# Patient Record
Sex: Female | Born: 1975 | Race: White | Hispanic: No | Marital: Married | Smoking: Never smoker
Health system: Southern US, Community
[De-identification: ages and names within clinical notes are randomized; demographics above are authoritative.]

## PROBLEM LIST (undated history)

## (undated) DIAGNOSIS — I471 Supraventricular tachycardia, unspecified: Secondary | ICD-10-CM

## (undated) HISTORY — DX: Supraventricular tachycardia, unspecified: I47.10

---

## 2006-07-18 ENCOUNTER — Inpatient Hospital Stay (HOSPITAL_COMMUNITY): Admission: AD | Admit: 2006-07-18 | Discharge: 2006-07-20 | Payer: Self-pay | Admitting: Obstetrics and Gynecology

## 2008-07-02 ENCOUNTER — Inpatient Hospital Stay (HOSPITAL_COMMUNITY): Admission: AD | Admit: 2008-07-02 | Discharge: 2008-07-03 | Payer: Self-pay | Admitting: Obstetrics and Gynecology

## 2010-04-24 ENCOUNTER — Inpatient Hospital Stay (HOSPITAL_COMMUNITY): Admission: AD | Admit: 2010-04-24 | Discharge: 2010-04-24 | Payer: Self-pay | Admitting: Obstetrics and Gynecology

## 2010-10-01 LAB — HCG, QUANTITATIVE, PREGNANCY: hCG, Beta Chain, Quant, S: 17438 m[IU]/mL — ABNORMAL HIGH (ref <5)

## 2010-12-01 NOTE — H&P (Signed)
Leslie Cline, Leslie Cline           ACCOUNT NO.:  [id number]   MEDICAL RECORD NO.:  [id number]          PATIENT TYPE:  INP   LOCATION:  9164                          [facility]   PHYSICIAN:  Crist Fat. Rivard, M.D. DATE OF BIRTH:  05/01/1976   DATE OF ADMISSION:  07/02/2008  DATE OF DISCHARGE:                              HISTORY & PHYSICAL   Leslie Cline is a 35 year old married white female, gravida 3, para 2-0-  0-2 at [redacted] weeks gestation for an [facility] of July 17, 2008, who presents  in active labor.  Denies leakage of fluid or vaginal bleeding.  Positive  fetal movement.  She is without complaints with the exception of pain  with her contractions.  She declines any pain intervention at present.  She has been followed by the [facility] service at [facility].   PAST MEDICAL HISTORY:  1. History of 4-1/2 hour labor with first delivery.  2. LATEX allergy which causes hives.   OBSTETRICAL HISTORY:  Gravida 1 with a spontaneous vaginal delivery in  June, 2006 at [redacted] weeks gestation, a female by the name of Leslie Cline.  Weight  was 7 pounds, 1 ounce after 4-1/2 hours of labor.  No anesthesia.  No  complications.  It was in [place], [place].  Did receive  Pitocin after delivery.  Gravida 2 with a spontaneous vaginal delivery  at 39 weeks in December, 2007.  A female by the name of Leslie Cline.  He  weighed 7 pounds, 12 ounces.  After 7-1/2 hours of labor.  Was delivered  by Wynelle Bourgeois.  That pregnancy was complicated by a vanishing twin.  Gravida 3 is current pregnancy.   PRENATAL LABS:  Blood type is A positive.  Rh antibody screen was  negative.  RPR nonreactive.  Rubella titer immune.  Hepatitis B surface  antigen negative.  Hemoglobin on January 12, 2008 was 12.1, hematocrit  36.1, platelets 234.  Gonorrhea and Chlamydia cultures negative.  Her  Pap was negative.  She did decline HIV at the time.   PAST MEDICAL HISTORY:  She denies medication allergies.  She does have a  LATEX allergy which  causes hives.   MENSTRUAL HISTORY:  Reports menarche at age 89.  Monthly cycles.  No  abnormalities.  LMP of October 11, 2007.  EDC on July 17, 2008.  Occasional yeast infection.  She had group beta strep with her first  pregnancy.  Varicella as a child.  Smoked from 1997 to 2000.   SURGICAL HISTORY:  Remarkable for wisdom teeth in 1996.  Does have drop  in blood pressure with anesthesia.  She had childhood meningitis at age  89 weeks.   FAMILY HISTORY:  Maternal grandmother and paternal grandfather.  Hypertension.  Paternal grandfather aneurysm.  Maternal grandfather  phlebitis after surgery.  Maternal grandfather and paternal grandfather:  Chronic renal disease.  Maternal aunt, numerous kidney stones.  Father  is bipolar.  Mother depression.  Father and mother alcoholic.  Father  also has had some drug abuse.   GENETIC HISTORY:  Remarkable for second pregnancy was a twin gestation  with a vanishing twin and  subsequent singleton gestation.   SOCIAL HISTORY:  She is a married white female.  She is of [place]  faith.  The father of the baby and her husband, his name is Leslie Cline.  He is involved and supportive.  Patient has had 17 years of  education.  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  Father of the baby has  had 17 years of education and is a Control and instrumentation engineer.  Denied alcohol,  tobacco, or illicit drug use during the pregnancy.   HISTORY OF PRESENT PREGNANCY:  She entered care for her new OB workup on  June 26.  She is around 13 weeks and 2 days.  Desired CMM care.  Pre-  gravid weight was 122.  She is 5 foot, 5 inches tall, so BMI of 20.3,  which is a normal weight.  Prenatal labs and Pap and cultures were  obtained that day.  Declined first trimester screen or other genetic  screening.  She did report that her youngest son was recovering from C.  diff and having some GI issues at that time and had been in the hospital  and followed by a GI specialist.  Several  times during the pregnancy,  she had been considering a home birth.  She had her anatomy ultrasound  at 20-5/7 weeks.  Normal anatomy, normal fluids, 3-vessel cord.  She had  a 1-hour glucola at 28-5/7 weeks.  Small diaphysis with no hernia noted.  Did decline receiving H1N1 vaccine.  She was referred in her third  trimester to dermatology regarding a large mole on her chest.  Plan was  made to remove after delivery.  Otherwise, her pregnancy progressed  without any other complications of significance until her presentation  in labor today.   OBJECTIVE:  VITAL SIGNS ON ADMISSION:  118/64, heart rate 80.  She was  afebrile.  Respirations were 18 in between contractions.  Fetal heart  rate 125.  Marked variability.  No decels.  Some 10 x 10 accelerations.  Toco on admission:  Contractions every 2-3 minutes.  Minor on palpation.  GENERAL:  She had labored breathing, grimaced during contractions.  She  is alert and oriented x3.  In between, was very pleasant with a sense of  humor.  HEENT:  Grossly intact and within normal limits.  She does have glasses.  CARDIOVASCULAR:  Regular rate and rhythm without murmur.  LUNGS:  Clear to auscultation bilaterally.  ABDOMEN:  Soft, nontender, and gravid.  PELVIC:  Cervix was 580, -1, with bulging membranes on exam on  admission.  EXTREMITIES:  Without edema.  No clonus.  DTRs are 2+.   IMPRESSION:  1. Intrauterine pregnancy at 38 weeks.  2. Group B strep negative.  3. Reassuring fetal heart tracing.  4. Active labor.   PLAN:  1. Admit to birthing suites with Dr. Estanislado Pandy as attending physician.  2. Routine L and D orders.  3. Support p.r.n.  4. AROM p.r.n.     Candice Slaton, CNM      Dois Davenport A. Rivard, M.D.  Electronically Signed   CHS/MEDQ  D:  07/02/2008  T:  07/02/2008  Job:  [id number]

## 2010-12-04 NOTE — H&P (Signed)
Leslie Cline, Leslie Cline           ACCOUNT NO.:  [id number]   MEDICAL RECORD NO.:  [id number]          PATIENT TYPE:  INP   LOCATION:  9164                          [facility]   PHYSICIAN:  Leslie Cline, M.D. DATE OF BIRTH:  1976/04/12   DATE OF ADMISSION:  07/18/2006  DATE OF DISCHARGE:                              HISTORY & PHYSICAL   HISTORY OF PRESENT ILLNESS:  Leslie Cline is a 35 year old gravida 2,  para 1-0-0-1 at 36 weeks who presents with uterine contractions every  five minutes for the last several hours.   History has been remarkable for:  1. History of rapid labor.  2. Initial twin pregnancy, now singleton.  3. Latex sensitivity.  4. Group B strep negative.   PRENATAL LABS:  Blood type is A positive.  Rh antibody negative.  VDRL  nonreactive.  Rubella titer positive.  Hepatitis B surface antigen  negative.  HIV was nonreactive.  Parvo titer were immune.  Cystic  fibrosis testing was declined.  GC and Chlamydia cultures were negative  in May.  Pap was normal.  Glucose challenge was normal.  Quadruple  screen was declined.  Hemoglobin upon entry into care was 12.2; it was  within normal limits at 28 weeks.  EDC of 07/24/2006 was established by  seven week ultrasound and questionable LMP.  Group B strep culture was  negative at 36 weeks.   HISTORY OF PRESENT PREGNANCY:  Patient transferred in from [place] to [facility] at 31 weeks.  She did have records  present.  This had initially been a twin pregnancy prior to eight weeks,  but then a singleton was only identified at eight weeks.  She had had no  complications.  Her pregnancy went well.  Group B strep culture was  negative.  She declined GC/Chlamydia cultures at 35 weeks.   OBSTETRICAL HISTORY:  In 2006 June she had a vaginal birth of a female  infant, weight 7 pounds 1 ounce, at 38 weeks.  She was in labor 4-1/2  hours.  She had no anesthesia.  She did have Pitocin during that labor.  She  did have positive Group B strep during that pregnancy.   She has an occasional yeast infection.  She reports usual childhood  illnesses.  She has a history of one urinary tract infection in the  past.  She was a smoker in the past but stopped back in 2002.   PAST SURGICAL HISTORY:  Wisdom teeth removed in 1996.  She did have a  low blood pressure following that.  Her only other hospitalization was  for childbirth and for meningitis at age 69 weeks.   FAMILY HISTORY:  Maternal grandmother and paternal grandfather had  hypertension.  Paternal grandfather had aneurysm.  Maternal grandfather  had phlebitis after surgery.  Maternal grandfather, maternal aunt and  paternal grandfather had numerous kidney stones.  Her father is bipolar.  Her mother has a history of depression.  Her father and mother are both  alcoholic, and her father was a drug abuser.   GENETIC HISTORY:  Unremarkable.   SOCIAL HISTORY:  Patient is married to the father of the baby.  He is  involved and supportive.  His name is Leslie Cline.  Patient is  graduate educated.  She is a Futures trader.  Her husband is also graduate  educated.  He is an Art gallery manager.  Patient has been followed by the  certified nurse midwife service at [facility].  She denies any  alcohol, drug or tobacco use during this pregnancy.  She is Caucasian  and of the [place] faith.   PHYSICAL EXAMINATION:  VITAL SIGNS:  Stable.  Patient is afebrile.  HEENT:  Within normal limits.  LUNGS:  Bilateral breath sounds are clear.  HEART:  Regular rate and rhythm without murmur.  BREASTS:  Soft and nontender.  ABDOMEN:  Fundal height is approximately 38 cm.  Estimated fetal weight  is 7-1/2 pounds.  Uterine contractions are every 3-5 minutes, moderate  quality.  PELVIC:  Cervical exam is 4 cm, 90%, vertex, and a -1 station, with  bulging bag of water.  Fetal heart rate is reassuring with some  scattered accelerations.  EXTREMITIES:  Deep tendon  reflexes are 2+ without clonus.  There is  trace edema noted.   IMPRESSION:  1. Intrauterine pregnancy at 39 weeks.  2. Early active labor.  3. Negative Group B strep.   PLAN:  1. Admitting to birthing suite per consult with Dr. Silverio Lay, who      is the attending physician.  2. Routine certified nurse midwife orders.  3. Patient desires a non-intervention birth as much as possible.  4. Anticipate normal spontaneous vaginal birth.      Leslie Cline, C.N.M.      Leslie Cline, M.D.  Electronically Signed    VLL/MEDQ  D:  07/18/2006  T:  07/18/2006  Job:  [id number]

## 2010-12-12 ENCOUNTER — Inpatient Hospital Stay (HOSPITAL_COMMUNITY)
Admission: AD | Admit: 2010-12-12 | Discharge: 2010-12-14 | DRG: 775 | Disposition: A | Discharge status: Home / Self Care | Payer: Managed Care, Other (non HMO) | Source: Ambulatory Visit | Attending: Obstetrics and Gynecology | Admitting: Obstetrics and Gynecology

## 2010-12-13 LAB — CBC
HCT: 34.5 % — ABNORMAL LOW (ref 36.0–46.0)
Hemoglobin: 11.1 g/dL — ABNORMAL LOW (ref 12.0–15.0)
MCH: 27.7 pg (ref 26.0–34.0)
MCHC: 32.2 g/dL (ref 30.0–36.0)
MCV: 86 fL (ref 78.0–100.0)
Platelets: 205 10*3/uL (ref 150–400)
RBC: 4.01 MIL/uL (ref 3.87–5.11)
RDW: 14.9 % (ref 11.5–15.5)
WBC: 9.6 10*3/uL (ref 4.0–10.5)

## 2010-12-13 LAB — RPR: RPR Ser Ql: NONREACTIVE

## 2010-12-14 LAB — CBC
HCT: 33.2 % — ABNORMAL LOW (ref 36.0–46.0)
Hemoglobin: 10.4 g/dL — ABNORMAL LOW (ref 12.0–15.0)
MCH: 27.4 pg (ref 26.0–34.0)
MCHC: 31.3 g/dL (ref 30.0–36.0)
MCV: 87.4 fL (ref 78.0–100.0)
Platelets: 203 10*3/uL (ref 150–400)
RBC: 3.8 MIL/uL — ABNORMAL LOW (ref 3.87–5.11)
RDW: 15.4 % (ref 11.5–15.5)
WBC: 7.8 10*3/uL (ref 4.0–10.5)

## 2010-12-28 NOTE — H&P (Signed)
Leslie Cline, Leslie Cline           ACCOUNT NO.:  [id number]  MEDICAL RECORD NO.:  [id number]           PATIENT TYPE:  I  LOCATION:  9124                          [facility]  PHYSICIAN:  Janine Limbo, M.D.DATE OF BIRTH:  [birth year]  DATE OF ADMISSION:  12/12/2010 DATE OF DISCHARGE:  12/14/2010                             HISTORY & PHYSICAL   Leslie Cline is a 35 year old gravida 4, para 3-0-0-3 at 39-2/7 weeks, who presents in active labor on the morning of Dec 13, 2010.  The patient's history has been remarkable for; 1. Latex allergy. 2. History of rapid labor. 3. Third trimester anemia. 4. Conception with Mirena. 5. Group B strep negative. 6. Plans water birth.  PRENATAL LABS:  Blood type is A+.  Rh antibody negative.  RPR nonreactive.  Hepatitis B surface antigen negative.  HIV was nonreactive.  Rubella titer was immune.  Hemoglobin upon entering the practice was 11.5.  Platelet count was within normal limits.  The patient declined any genetic screening.  She had a thyroid panel and vitamin D done at 13 weeks for fatigue.  These were all within normal limits.  She had an 1-hour Glucola that was normal.  Hemoglobin was 9.7 at 28 weeks.  Group B strep culture was negative at 36 weeks.  HISTORY OF PRESENT PREGNANCY:  The patient entered care at approximately 8 weeks.  She declined screening.  She has conceived on Mirena IUD and this was removed by Dr. Stefano Gaul on April 27, 2010 in early pregnancy without difficulty.  She had an ultrasound at her first visit showing normal growth and development and EDC of December 20, 2010.  She had increased nausea and vomiting and fatigue around 13 weeks.  She had TSH, thyroid panel, and vitamin D done that were all normal.  She was in physical therapy for sacral and pelvic pain and pelvic relaxation.  In 19 weeks, she had another ultrasound, showing normal growth and development.  She had some vulvar and leg varicosities at 28  weeks. Comfort measures were reviewed.  Her hemoglobin was 9.2 at 28 weeks. Her Glucola was normal.  She was recommended to take Floradix.  She had some early contractions at 35 weeks.  Fetal fibronectin was done, and it was negative.  Thus, her pregnancy was essentially uncomplicated by 34 weeks.  She decided that she wanted to do a water birth and had obtained a tub, attended class, and signed a consent.  OBSTETRICAL HISTORY:  In 2006, she had a vaginal birth of a female infant, weight 7 pounds 1 ounce at 38 weeks.  She was in labor 4-1//2 hours. She had no anesthesia.  The child was born in [place].  In 2007, she had a vaginal birth of a female infant, weight 7 pounds 12 ounces at 39 weeks.  She was in labor 7-1/2 hours that had been an initial twin pregnancy that spontaneously reduced itself in the first trimester to the singleton.  In 2009, she had a vaginal birth of a female infant, weight 7 pounds 9 ounces at 38 weeks.  She was in labor less than 4 hours.  She had no  anesthesia.  She did conceive this pregnancy with a Mirena in place.  This was discontinued in early pregnancy by Dr. Stefano Gaul.  MEDICAL HISTORY:  She reports usual childhood illnesses.  She had a history of one UTI in the past.  SURGICAL HISTORY:  Includes, wisdom teeth removed in 1996.  She did have decreased blood pressure during that procedure.  She was hospitalized with meningitis when she was 35 years old and then for childbirth three times.  ALLERGIES:  She has sensitive to LATEX which causes hives.  FAMILY HISTORY:  Maternal grandmother, paternal grandfather had hypertension.  Her paternal grandfather had an aneurysm.  Maternal grandfather had phlebitis after surgery.  Maternal grandfather and paternal grandfather and maternal aunt all had history of renal stones. Her father is bipolar.  Her mother had depression.  Her father and mother are alcoholics and her father also used drugs.  GENETIC HISTORY:   Remarkable for the second pregnancy being a twin gestation that reduced itself spontaneously to a singleton.  SOCIAL HISTORY:  The patient is married to the father of baby.  He is involved and supportive. His name is Cyla Haluska.  The patient is of the [place] faith.  She is Caucasian.  She is graduate educated.  She is a stay-at-home mom.  Her husband is also graduated.  He is an Art gallery manager with [employer]  She has been followed by the certified nurse midwife service, [place].  She denies any alcohol, drug, or tobacco use during this pregnancy.  She was a previous smoker.  PHYSICAL EXAM:  VITAL SIGNS:  Stable.  The patient is febrile. HEENT: Within normal limits. LUNGS:  Breath sounds are clear. HEART:  Regular rate and rhythm without murmur. BREASTS:  Soft and nontender. ABDOMEN:  Fundal height is approximately 38 weeks.  Uterine contractions every 3-4 minutes.  The patient is 4-5 cm, 80% vertex at -1 station with bulging bag of water.   Fetal heart rate is reassuring and reactive on external monitor.  Membranes are intact.   EXTREMITIES:  Deep tendon reflexes are 2+ without clonus.  There is a trace edema noted.  IMPRESSION: 1. Intrauterine pregnancy at 39-2/7 weeks. 2. Active labor. 3. History of rapid labor. 4. Negative group B strep. 5. Plans water birth.  PLAN: 1. Admit to birthing suite per consult with Dr. Marline Backbone as     attending physician. 2. Routine certified nurse midwife orders. 3. The patient will use birth tub and we may monitor intermittently.    Renaldo Reel Emilee Hero, C.N.M.   ______________________________ Janine Limbo, M.D.   VLL/MEDQ  D:  12/15/2010  T:  12/16/2010  Job:  [id number]  Electronically Signed by Nigel Bridgeman C.N.M. on 12/17/2010 11:05:26 AM Electronically Signed by Kirkland Hun M.D. on 12/28/2010 03:48:44 PM

## 2011-04-23 LAB — CBC
HCT: 33 % — ABNORMAL LOW (ref 36.0–46.0)
HCT: 35.8 % — ABNORMAL LOW (ref 36.0–46.0)
Hemoglobin: 11.6 g/dL — ABNORMAL LOW (ref 12.0–15.0)
Hemoglobin: 12 g/dL (ref 12.0–15.0)
MCHC: 33.6 g/dL (ref 30.0–36.0)
MCHC: 35.1 g/dL (ref 30.0–36.0)
MCV: 91.7 fL (ref 78.0–100.0)
MCV: 92.3 fL (ref 78.0–100.0)
Platelets: 228 10*3/uL (ref 150–400)
Platelets: 233 10*3/uL (ref 150–400)
RBC: 3.6 MIL/uL — ABNORMAL LOW (ref 3.87–5.11)
RBC: 3.88 MIL/uL (ref 3.87–5.11)
RDW: 13.1 % (ref 11.5–15.5)
RDW: 13.2 % (ref 11.5–15.5)
WBC: 8.3 10*3/uL (ref 4.0–10.5)
WBC: 9.9 10*3/uL (ref 4.0–10.5)

## 2011-04-23 LAB — RPR: RPR Ser Ql: NONREACTIVE

## 2011-04-23 LAB — RAPID HIV SCREEN (WH-MAU): Rapid HIV Screen: NONREACTIVE

## 2012-05-16 ENCOUNTER — Ambulatory Visit: Payer: Self-pay | Admitting: Obstetrics and Gynecology

## 2014-09-06 ENCOUNTER — Other Ambulatory Visit: Payer: Self-pay | Admitting: Internal Medicine

## 2014-09-06 DIAGNOSIS — R29818 Other symptoms and signs involving the nervous system: Secondary | ICD-10-CM

## 2014-09-16 ENCOUNTER — Other Ambulatory Visit: Payer: Managed Care, Other (non HMO)

## 2017-10-17 ENCOUNTER — Other Ambulatory Visit: Payer: Self-pay | Admitting: Neurosurgery

## 2017-10-17 DIAGNOSIS — D481 Neoplasm of uncertain behavior of connective and other soft tissue: Secondary | ICD-10-CM

## 2017-10-28 ENCOUNTER — Ambulatory Visit
Admission: RE | Admit: 2017-10-28 | Discharge: 2017-10-28 | Disposition: A | Discharge status: Home / Self Care | Payer: Commercial Managed Care - PPO | Source: Ambulatory Visit | Attending: Neurosurgery | Admitting: Neurosurgery

## 2017-10-28 ENCOUNTER — Other Ambulatory Visit: Payer: Self-pay | Admitting: Neurosurgery

## 2017-10-28 DIAGNOSIS — D481 Neoplasm of uncertain behavior of connective and other soft tissue: Secondary | ICD-10-CM

## 2019-07-16 ENCOUNTER — Other Ambulatory Visit: Payer: Self-pay

## 2019-07-16 ENCOUNTER — Ambulatory Visit (INDEPENDENT_AMBULATORY_CARE_PROVIDER_SITE_OTHER): Payer: Commercial Managed Care - PPO | Admitting: Family Medicine

## 2019-07-16 ENCOUNTER — Ambulatory Visit (INDEPENDENT_AMBULATORY_CARE_PROVIDER_SITE_OTHER): Payer: Commercial Managed Care - PPO

## 2019-07-16 ENCOUNTER — Encounter: Payer: Self-pay | Admitting: Family Medicine

## 2019-07-16 VITALS — BP 120/70 | HR 94 | Ht 65.0 in

## 2019-07-16 DIAGNOSIS — M5416 Radiculopathy, lumbar region: Secondary | ICD-10-CM

## 2019-07-16 DIAGNOSIS — G8929 Other chronic pain: Secondary | ICD-10-CM

## 2019-07-16 DIAGNOSIS — M545 Low back pain, unspecified: Secondary | ICD-10-CM

## 2019-07-16 MED ORDER — PREDNISONE 50 MG PO TABS: ORAL_TABLET | ORAL | 0 refills | Status: AC

## 2019-07-16 MED ORDER — METHYLPREDNISOLONE ACETATE 80 MG/ML IJ SUSP
80.0000 mg | Freq: Once | INTRAMUSCULAR | Status: AC
  Administered 2019-07-16: 13:00:00 80 mg via INTRAMUSCULAR

## 2019-07-16 MED ORDER — GABAPENTIN 100 MG PO CAPS: 200.0000 mg | ORAL_CAPSULE | Freq: Every day | ORAL | 3 refills | Status: DC

## 2019-07-16 MED ORDER — KETOROLAC TROMETHAMINE 60 MG/2ML IM SOLN
60.0000 mg | Freq: Once | INTRAMUSCULAR | Status: AC
  Administered 2019-07-16: 60 mg via INTRAMUSCULAR

## 2019-07-16 NOTE — Assessment & Plan Note (Signed)
Lumbar radiculopathy.  Patient has positive straight leg test on the right side.  Mostly no weakness.  Deep tendon reflexes intact.  Patient has had a cyst noted on MRI previously but it was left-sided.  At this time still symptoms seem to be of the right side.  Discussed the possibility of epidurals which patient has declined.  Toradol and Depo-Medrol given today.  Different home exercises, discussed which activities to potentially avoid.  Discussed icing regimen, patient will try to make these changes and follow-up with me again in.  Patient will then come back in 2 weeks and consider manipulation due to the severity of pain today we will also get x-rays.

## 2019-07-16 NOTE — Progress Notes (Signed)
[facility] [address] [place], [place] [address] Phone: [phone] Subjective:     This visit occurred during the SARS-CoV-2 public health emergency.  Safety protocols were in place, including screening questions prior to the visit, additional usage of staff PPE, and extensive cleaning of exam room while observing appropriate contact time as indicated for disinfecting solutions.       CC: Low back pain  [id number]  Leslie Cline is a 43 y.o. female coming in with complaint of low back pain.  Has had this intermittently for nearly 2 years.  Patient has done formal physical therapy with no significant movement.  States it can come and go but then seems to worsen and often stops daily activities even Has seen neurosurgery previously and did have an MRI of the lumbar spine April 2019.  This was independently visualized by me. 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  Cyst that was previously stated was at L5-S1     Social History   Socioeconomic History  . Marital status: Married    Spouse name: Not on file  . Number of children: Not on file  . Years of education: Not on file  . Highest education level: Not on file  Occupational History  . Not on file  Tobacco Use  . Smoking status: Not on file  Substance and Sexual Activity  . Alcohol use: Not on file  . Drug use: Not on file  . Sexual activity: Not on file  Other Topics Concern  . Not on file  Social History Narrative  . Not on file   Social Determinants of Health   Financial Resource Strain:   . Difficulty of Paying Living Expenses: Not on file  Food Insecurity:   . Worried About Charity fundraiser in the Last Year: Not on file  . Ran Out of Food in the Last Year: Not on file  Transportation Needs:   . Lack of Transportation  (Medical): Not on file  . Lack of Transportation (Non-Medical): Not on file  Physical Activity:   . Days of Exercise per Week: Not on file  . Minutes of Exercise per Session: Not on file  Stress:   . Feeling of Stress : Not on file  Social Connections:   . Frequency of Communication with Friends and Family: Not on file  . Frequency of Social Gatherings with Friends and Family: Not on file  . Attends Religious Services: Not on file  . Active Member of Clubs or Organizations: Not on file  . Attends Archivist Meetings: Not on file  . Marital Status: Not on file   Not on File No family history on file.  No family history of autoimmune  Current Outpatient Medications (Endocrine & Metabolic):  .  predniSONE (DELTASONE) 50 MG tablet, 1 tablet by mouth daily      Current Outpatient Medications (Other):  .  gabapentin (NEURONTIN) 100 MG capsule, Take 2 capsules (200 mg total) by mouth at bedtime.    Past medical history, social, surgical and family history all reviewed in electronic medical record.  No pertanent information unless stated regarding to the chief complaint.   Review of Systems:  No headache, visual changes, nausea, vomiting, diarrhea, constipation, dizziness, abdominal pain, skin rash, fevers, chills, night sweats, weight loss, swollen lymph  nodes, body aches, joint swelling, muscle aches, chest pain, shortness of breath, mood changes.   Objective  Blood pressure 120/70, pulse 94, height 5\' 5"  (1.651 m), last menstrual period 06/19/2019, SpO2 98 %. Systems examined below as of    General: No apparent distress alert and oriented x3 mood and affect normal, dressed appropriately.  HEENT: Pupils equal, extraocular movements intact  Respiratory: Patient's speak in full sentences and does not appear short of breath  Cardiovascular: No lower extremity edema, non tender, no erythema  Skin: Warm dry intact with no signs of infection or rash on extremities or on axial  skeleton.  Abdomen: Soft nontender  Neuro: Cranial nerves II through XII are intact, neurovascularly intact in all extremities with 2+ DTRs and 2+ pulses.  Lymph: No lymphadenopathy of posterior or anterior cervical chain or axillae bilaterally.  Gait normal with good balance and coordination.  MSK:  Non tender with full range of motion and good stability and symmetric strength and tone of shoulders, elbows, wrist, hip, knee and ankles bilaterally.  Back exam does have some mild loss of lordosis.  Positive straight leg test on the right side at 20 degrees of forward flexion with radicular symptoms.  Patient has tenderness to palpation in the paraspinal musculature from L4-S1 bilaterally.  Mild pain in the piriformis bilaterally.  Neurovascular intact and deep tendon reflexes.    [id number]; 15 additional minutes spent for Therapeutic exercises as stated in above notes.  This included exercises focusing on stretching, strengthening, with significant focus on eccentric aspects.   Long term goals include an improvement in range of motion, strength, endurance as well as avoiding reinjury. Patient's frequency would include in 1-2 times a day, 3-5 times a week for a duration of 6-12 weeks. Low back exercises that included:  Pelvic tilt/bracing instruction to focus on control of the pelvic girdle and lower abdominal muscles  Glute strengthening exercises, focusing on proper firing of the glutes without engaging the low back muscles Proper stretching techniques for maximum relief for the hamstrings, hip flexors, low back and some rotation where tolerated    Proper technique shown and discussed handout in great detail with ATC.  All questions were discussed and answered.   Impression and Recommendations:     This case required medical decision making of moderate complexity. The above documentation has been reviewed and is accurate and complete Leslie Pulley, DO       Note: This dictation was prepared  with Dragon dictation along with smaller phrase technology. Any transcriptional errors that result from this process are unintentional.

## 2019-07-16 NOTE — Progress Notes (Deleted)
[facility] [address] [place], [place] [address] Phone: [phone] Subjective:   I Leslie Cline am serving as a Education administrator for Dr. Hulan Saas.  This visit occurred during the SARS-CoV-2 public health emergency.  Safety protocols were in place, including screening questions prior to the visit, additional usage of staff PPE, and extensive cleaning of exam room while observing appropriate contact time as indicated for disinfecting solutions.   I'm seeing this patient by the request  of:    CC:   [id number]  Leslie Cline is a 43 y.o. female coming in with complaint of back pain. No prior injury. Remembers playing on the carpet for about 5 mins playing with her baby cousin. Has had issues with her back before. Done a year of PT and has had xrays and MRIs. Pain radiates down the legs. Legs ache. States she feels as if she has done too many squats. Has had ulcers in the past.   Onset- Saturday afternoon  Location - Lumbar Duration-  Character- sharp, dull, achy, sore, throbbing Aggravating factors- standing, flexion/bending, laying down  Reliving factors-  Therapies tried- ice, topical, oral, heat Severity-  8/10 at its worse      No past medical history on file. *** The histories are not reviewed yet. Please review them in the "History" navigator section and refresh this [place]. Social History   Socioeconomic History  . Marital status: Married    Spouse name: Not on file  . Number of children: Not on file  . Years of education: Not on file  . Highest education level: Not on file  Occupational History  . Not on file  Tobacco Use  . Smoking status: Not on file  Substance and Sexual Activity  . Alcohol use: Not on file  . Drug use: Not on file  . Sexual activity: Not on file  Other Topics Concern  . Not on file  Social History Narrative  . Not on file   Social Determinants of Health   Financial Resource Strain:   . Difficulty  of Paying Living Expenses: Not on file  Food Insecurity:   . Worried About Charity fundraiser in the Last Year: Not on file  . Ran Out of Food in the Last Year: Not on file  Transportation Needs:   . Lack of Transportation (Medical): Not on file  . Lack of Transportation (Non-Medical): Not on file  Physical Activity:   . Days of Exercise per Week: Not on file  . Minutes of Exercise per Session: Not on file  Stress:   . Feeling of Stress : Not on file  Social Connections:   . Frequency of Communication with Friends and Family: Not on file  . Frequency of Social Gatherings with Friends and Family: Not on file  . Attends Religious Services: Not on file  . Active Member of Clubs or Organizations: Not on file  . Attends Archivist Meetings: Not on file  . Marital Status: Not on file   Not on File No family history on file. No current outpatient medications on file.    Past medical history, social, surgical and family history all reviewed in electronic medical record.  No pertanent information unless stated regarding to the chief complaint.   Review of Systems:  No headache, visual changes, nausea, vomiting, diarrhea, constipation, dizziness, abdominal pain, skin rash, fevers, chills, night sweats, weight loss, swollen lymph nodes, body aches, joint swelling, muscle aches, chest pain, shortness  of breath, mood changes.   Objective  There were no vitals taken for this visit. Systems examined below as of    General: No apparent distress alert and oriented x3 mood and affect normal, dressed appropriately.  HEENT: Pupils equal, extraocular movements intact  Respiratory: Patient's speak in full sentences and does not appear short of breath  Cardiovascular: No lower extremity edema, non tender, no erythema  Skin: Warm dry intact with no signs of infection or rash on extremities or on axial skeleton.  Abdomen: Soft nontender  Neuro: Cranial nerves II through XII are intact,  neurovascularly intact in all extremities with 2+ DTRs and 2+ pulses.  Lymph: No lymphadenopathy of posterior or anterior cervical chain or axillae bilaterally.  Gait normal with good balance and coordination.  MSK:  Non tender with full range of motion and good stability and symmetric strength and tone of shoulders, elbows, wrist, hip, knee and ankles bilaterally.     Impression and Recommendations:     This case required medical decision making of moderate complexity. The above documentation has been reviewed and is accurate and complete Leslie Cline [place]       Note: This dictation was prepared with Dragon dictation along with smaller phrase technology. Any transcriptional errors that result from this process are unintentional.

## 2019-07-16 NOTE — Patient Instructions (Addendum)
Happy Holidays! Prednisone for 5 days Xray back Exercise 3 times a week Tart cherry extract 1200mg  at night Vitamin D 2000 IU daily  See me again in 2 weeks for manipulation

## 2019-07-17 ENCOUNTER — Ambulatory Visit: Payer: Commercial Managed Care - PPO | Admitting: Family Medicine

## 2019-08-02 ENCOUNTER — Other Ambulatory Visit: Payer: Self-pay

## 2019-08-02 ENCOUNTER — Encounter: Payer: Self-pay | Admitting: Family Medicine

## 2019-08-02 ENCOUNTER — Other Ambulatory Visit (INDEPENDENT_AMBULATORY_CARE_PROVIDER_SITE_OTHER): Payer: Commercial Managed Care - PPO

## 2019-08-02 ENCOUNTER — Ambulatory Visit (INDEPENDENT_AMBULATORY_CARE_PROVIDER_SITE_OTHER): Payer: Commercial Managed Care - PPO | Admitting: Family Medicine

## 2019-08-02 VITALS — BP 98/76 | HR 82 | Ht 65.0 in | Wt 140.0 lb

## 2019-08-02 DIAGNOSIS — M2550 Pain in unspecified joint: Secondary | ICD-10-CM | POA: Diagnosis not present

## 2019-08-02 DIAGNOSIS — M5416 Radiculopathy, lumbar region: Secondary | ICD-10-CM | POA: Diagnosis not present

## 2019-08-02 LAB — COMPREHENSIVE METABOLIC PANEL
ALT: 11 U/L (ref 0–35)
AST: 15 U/L (ref 0–37)
Albumin: 4.4 g/dL (ref 3.5–5.2)
Alkaline Phosphatase: 40 U/L (ref 39–117)
BUN: 18 mg/dL (ref 6–23)
CO2: 28 mEq/L (ref 19–32)
Calcium: 9.3 mg/dL (ref 8.4–10.5)
Chloride: 104 mEq/L (ref 96–112)
Creatinine, Ser: 0.87 mg/dL (ref 0.40–1.20)
GFR: 71.39 mL/min (ref >60.00)
Glucose, Bld: 102 mg/dL — ABNORMAL HIGH (ref 70–99)
Potassium: 3.5 mEq/L (ref 3.5–5.1)
Sodium: 139 mEq/L (ref 135–145)
Total Bilirubin: 0.6 mg/dL (ref 0.2–1.2)
Total Protein: 7.2 g/dL (ref 6.0–8.3)

## 2019-08-02 LAB — CBC WITH DIFFERENTIAL/PLATELET
Basophils Absolute: 0 10*3/uL (ref 0.0–0.1)
Basophils Relative: 0.7 % (ref 0.0–3.0)
Eosinophils Absolute: 0.2 10*3/uL (ref 0.0–0.7)
Eosinophils Relative: 2.5 % (ref 0.0–5.0)
HCT: 37.7 % (ref 36.0–46.0)
Hemoglobin: 12.5 g/dL (ref 12.0–15.0)
Lymphocytes Relative: 25.3 % (ref 12.0–46.0)
Lymphs Abs: 1.6 10*3/uL (ref 0.7–4.0)
MCHC: 33.2 g/dL (ref 30.0–36.0)
MCV: 89.1 fl (ref 78.0–100.0)
Monocytes Absolute: 0.6 10*3/uL (ref 0.1–1.0)
Monocytes Relative: 8.7 % (ref 3.0–12.0)
Neutro Abs: 4.1 10*3/uL (ref 1.4–7.7)
Neutrophils Relative %: 62.8 % (ref 43.0–77.0)
Platelets: 321 10*3/uL (ref 150.0–400.0)
RBC: 4.23 Mil/uL (ref 3.87–5.11)
RDW: 12.8 % (ref 11.5–15.5)
WBC: 6.4 10*3/uL (ref 4.0–10.5)

## 2019-08-02 LAB — URIC ACID: Uric Acid, Serum: 4 mg/dL (ref 2.4–7.0)

## 2019-08-02 LAB — IBC PANEL
Iron: 116 ug/dL (ref 42–145)
Saturation Ratios: 33.1 % (ref 20.0–50.0)
Transferrin: 250 mg/dL (ref 212.0–360.0)

## 2019-08-02 LAB — SEDIMENTATION RATE: Sed Rate: 3 mm/hr (ref 0–20)

## 2019-08-02 LAB — TSH: TSH: 1.42 u[IU]/mL (ref 0.35–4.50)

## 2019-08-02 LAB — FERRITIN: Ferritin: 12.5 ng/mL (ref 10.0–291.0)

## 2019-08-02 LAB — C-REACTIVE PROTEIN: CRP: <1 mg/dL (ref 0.5–20.0)

## 2019-08-02 NOTE — Progress Notes (Signed)
Leslie Cline [place] Phone: [phone] Subjective:   [name], am serving as a scribe for Dr. Hulan Saas. This visit occurred during the SARS-CoV-2 public health emergency.  Safety protocols were in place, including screening questions prior to the visit, additional usage of staff PPE, and extensive cleaning of exam room while observing appropriate contact time as indicated for disinfecting solutions.   CC: Back pain follow-up  [id number]   07/16/2019 Lumbar radiculopathy.  Patient has positive straight leg test on the right side.  Mostly no weakness.  Deep tendon reflexes intact.  Patient has had a cyst noted on MRI previously but it was left-sided.  At this time still symptoms seem to be of the right side.  Discussed the possibility of epidurals which patient has declined.  Toradol and Depo-Medrol given today.  Different home exercises, discussed which activities to potentially avoid.  Discussed icing regimen, patient will try to make these changes and follow-up with me again in.  Patient will then come back in 2 weeks and consider manipulation due to the severity of pain today we will also get x-rays  Update 08/02/2019 Leslie Cline is a 44 y.o. female coming in with complaint of back pain. Back remains stiff and notices soreness in glutes. Radicular symptoms have subsided. Has been supplements and HEP. Only used 2 of prednisone and using gabapentin at night.  Patient says that she is feeling approximately 60% better.    No past medical history on file. No past surgical history on file. Social History   Socioeconomic History  . Marital status: Married    Spouse name: Not on file  . Number of children: Not on file  . Years of education: Not on file  . Highest education level: Not on file  Occupational History  . Not on file  Tobacco Use  . Smoking status: Not on file  Substance and Sexual Activity  .  Alcohol use: Not on file  . Drug use: Not on file  . Sexual activity: Not on file  Other Topics Concern  . Not on file  Social History Narrative  . Not on file   Social Determinants of Health   Financial Resource Strain:   . Difficulty of Paying Living Expenses: Not on file  Food Insecurity:   . Worried About Charity fundraiser in the Last Year: Not on file  . Ran Out of Food in the Last Year: Not on file  Transportation Needs:   . Lack of Transportation (Medical): Not on file  . Lack of Transportation (Non-Medical): Not on file  Physical Activity:   . Days of Exercise per Week: Not on file  . Minutes of Exercise per Session: Not on file  Stress:   . Feeling of Stress : Not on file  Social Connections:   . Frequency of Communication with Friends and Family: Not on file  . Frequency of Social Gatherings with Friends and Family: Not on file  . Attends Religious Services: Not on file  . Active Member of Clubs or Organizations: Not on file  . Attends Archivist Meetings: Not on file  . Marital Status: Not on file   Not on File no known drug allergies No family history on file.  No family history of autoimmune  Current Outpatient Medications (Endocrine & Metabolic):  .  predniSONE (DELTASONE) 50 MG tablet, 1 tablet by mouth daily      Current Outpatient Medications (  Other):  .  gabapentin (NEURONTIN) 100 MG capsule, Take 2 capsules (200 mg total) by mouth at bedtime.    Past medical history, social, surgical and family history all reviewed in electronic medical record.  No pertanent information unless stated regarding to the chief complaint.   Review of Systems:  No headache, visual changes, nausea, vomiting, diarrhea, constipation, dizziness, abdominal pain, skin rash, fevers, chills, night sweats, weight loss, swollen lymph nodes, body aches, joint swelling, muscle aches, chest pain, shortness of breath, mood changes.   Objective  Blood pressure 98/76,  pulse 82, height 5\' 5"  (1.651 m), weight 140 lb (63.5 kg).    General: No apparent distress alert and oriented x3 mood and affect normal, dressed appropriately.  HEENT: Pupils equal, extraocular movements intact  Respiratory: Patient's speak in full sentences and does not appear short of breath  Cardiovascular: No lower extremity edema, non tender, no erythema  Skin: Warm dry intact with no signs of infection or rash on extremities or on axial skeleton.  Abdomen: Soft nontender  Neuro: Cranial nerves II through XII are intact, neurovascularly intact in all extremities with 2+ DTRs and 2+ pulses.  Lymph: No lymphadenopathy of posterior or anterior cervical chain or axillae bilaterally.  Gait normal with good balance and coordination.  MSK:  Non tender with full range of motion and good stability and symmetric strength and tone of shoulders, elbows, wrist, hip, knee and ankles bilaterally.    Patient back exam does have some loss of lordosis.  Still some mild radicular symptoms into the left buttocks with straight leg test.  Strength though seems to be there.  Some mild limited range of motion in extension and flexion of 5 degrees.  Worsening pain on the left side.    Impression and Recommendations:     This case required medical decision making of moderate complexity. The above documentation has been reviewed and is accurate and complete Lyndal Pulley, DO       Note: This dictation was prepared with Dragon dictation along with smaller phrase technology. Any transcriptional errors that result from this process are unintentional.

## 2019-08-02 NOTE — Patient Instructions (Signed)
Good to see you Labs today Keep doing exercises Take gabapentin as needed Keep up the vitamins  See me again in 6 weeks

## 2019-08-02 NOTE — Assessment & Plan Note (Signed)
Patient has made significant improvement at this time.  Patient was fairly noncompliant with the prednisone.  Progress with the exercises and states that she is feeling 60% better.  Gabapentin does help her at night and told her that it is okay for her to do on a regular basis.  Patient is going to consider the possibility of formal physical therapy or epidurals.  Patient will follow up with me again 6 weeks otherwise.  Spent  25 minutes with patient face-to-face and had greater than 50% of counseling including as described above in assessment and plan.

## 2019-08-05 LAB — VITAMIN D 1,25 DIHYDROXY
Vitamin D 1, 25 (OH)2 Total: 26 pg/mL (ref 18–72)
Vitamin D2 1, 25 (OH)2: <8 pg/mL
Vitamin D3 1, 25 (OH)2: 26 pg/mL

## 2019-08-05 LAB — ANA: Anti Nuclear Antibody (ANA): NEGATIVE

## 2019-08-05 LAB — RHEUMATOID FACTOR: Rheumatoid fact SerPl-aCnc: <14 IU/mL (ref <14)

## 2019-08-05 LAB — PTH, INTACT AND CALCIUM
Calcium: 9.4 mg/dL (ref 8.6–10.2)
PTH: 21 pg/mL (ref 14–64)

## 2019-08-05 LAB — ANGIOTENSIN CONVERTING ENZYME: Angiotensin-Converting Enzyme: 16 U/L (ref 9–67)

## 2019-08-05 LAB — CYCLIC CITRUL PEPTIDE ANTIBODY, IGG: Cyclic Citrullin Peptide Ab: <16 UNITS

## 2019-08-05 LAB — CALCIUM, IONIZED: Calcium, Ion: 4.89 mg/dL (ref 4.8–5.6)

## 2019-08-27 ENCOUNTER — Telehealth: Payer: Self-pay | Admitting: Family Medicine

## 2019-08-27 NOTE — Telephone Encounter (Signed)
Patient called asking for the results from her resent lab work. Please advise.

## 2019-08-28 NOTE — Telephone Encounter (Signed)
All labs normal

## 2019-08-28 NOTE — Telephone Encounter (Signed)
Attempted to call patient. No voicemail.

## 2019-08-28 NOTE — Telephone Encounter (Signed)
Attempted to contact patient. No voicemail.

## 2019-09-13 ENCOUNTER — Ambulatory Visit: Payer: Self-pay | Admitting: Family Medicine

## 2020-03-03 ENCOUNTER — Other Ambulatory Visit: Payer: Self-pay

## 2020-03-03 ENCOUNTER — Ambulatory Visit (INDEPENDENT_AMBULATORY_CARE_PROVIDER_SITE_OTHER): Payer: Managed Care, Other (non HMO) | Admitting: Family Medicine

## 2020-03-03 ENCOUNTER — Encounter: Payer: Self-pay | Admitting: Family Medicine

## 2020-03-03 VITALS — BP 108/76 | HR 78 | Ht 65.0 in | Wt 140.0 lb

## 2020-03-03 DIAGNOSIS — M545 Low back pain, unspecified: Secondary | ICD-10-CM

## 2020-03-03 MED ORDER — KETOROLAC TROMETHAMINE 30 MG/ML IJ SOLN
30.0000 mg | Freq: Once | INTRAMUSCULAR | Status: AC
  Administered 2020-03-03: 30 mg via INTRAMUSCULAR

## 2020-03-03 MED ORDER — METHYLPREDNISOLONE ACETATE 40 MG/ML IJ SUSP
40.0000 mg | Freq: Once | INTRAMUSCULAR | Status: AC
  Administered 2020-03-03: 40 mg via INTRAMUSCULAR

## 2020-03-03 NOTE — Patient Instructions (Signed)
Nice to meet you Please try the exercises  Please try compression and heat  Please try the pennsaid   Please send me a message in MyChart with any questions or updates.  Please see me back in 3-4 weeks.   --Dr. Raeford Razor

## 2020-03-03 NOTE — Progress Notes (Signed)
Medication Samples have been provided to the patient.  Drug name: Pennsaid       Strength: 2%        Qty: 2 boxes  LOT: [id number]  Exp.Date: 08/2020  Dosing instructions: Use a pea size amount and rub gently.  The patient has been instructed regarding the correct time, dose, and frequency of taking this medication, including desired effects and most common side effects.   Sherrie George, [place] 12:15 PM 03/03/2020

## 2020-03-03 NOTE — Progress Notes (Signed)
Leslie Cline - 44 y.o. female MRN [id number]  Date of birth: Apr 01, 1976  SUBJECTIVE:  Including CC & ROS.  Chief Complaint  Patient presents with  . Back Pain    bilateral low back    Leslie Cline is a 44 y.o. female that is presenting with acute on chronic low back pain.  She feels this in the lower aspect and has bilateral radiation.  She denies radicular symptoms.  Denies any specific inciting event.  Seems to be worse in the mornings.  Has trouble transitioning from certain positions.  Has pain with lying flat on her back.  No history of surgery..  Independent review of the lumbar spine x-ray from 06/2019 shows degenerative disc changes at L5-S1. Independent review of the lumbar MRI from 2019 shows a cystic structure on the left at L2-3.  Review of Systems See HPI   HISTORY: Past Medical, Surgical, Social, and Family History Reviewed & Updated per EMR.   Pertinent Historical Findings include:  No past medical history on file.  No past surgical history on file.  No family history on file.  Social History   Socioeconomic History  . Marital status: Married    Spouse name: Not on file  . Number of children: Not on file  . Years of education: Not on file  . Highest education level: Not on file  Occupational History  . Not on file  Tobacco Use  . Smoking status: Never Smoker  . Smokeless tobacco: Never Used  Substance and Sexual Activity  . Alcohol use: Not on file  . Drug use: Not on file  . Sexual activity: Not on file  Other Topics Concern  . Not on file  Social History Narrative  . Not on file   Social Determinants of Health   Financial Resource Strain:   . Difficulty of Paying Living Expenses:   Food Insecurity:   . Worried About Charity fundraiser in the Last Year:   . Arboriculturist in the Last Year:   Transportation Needs:   . Film/video editor (Medical):   Marland Kitchen Lack of Transportation (Non-Medical):   Physical Activity:   . Days of  Exercise per Week:   . Minutes of Exercise per Session:   Stress:   . Feeling of Stress :   Social Connections:   . Frequency of Communication with Friends and Family:   . Frequency of Social Gatherings with Friends and Family:   . Attends Religious Services:   . Active Member of Clubs or Organizations:   . Attends Archivist Meetings:   Marland Kitchen Marital Status:   Intimate Partner Violence:   . Fear of Current or Ex-Partner:   . Emotionally Abused:   Marland Kitchen Physically Abused:   . Sexually Abused:      PHYSICAL EXAM:  VS: BP 108/76 (Patient Position: Standing)   Pulse 78   Ht 5\' 5"  (1.651 m)   Wt 140 lb (63.5 kg)   BMI 23.30 kg/m  Physical Exam Gen: NAD, alert, cooperative with exam, well-appearing MSK:  Back: Tenderness to palpation in the paraspinal lumbar muscles. Limited flexion extension. Normal hip flexion. Weakness with hip abduction. No tenderness to palpation over the SI joint. Neurovascularly intact     ASSESSMENT & PLAN:   Low back pain Seems more spasm related.  She does have degenerative changes observed on previous imaging.  No radicular symptoms currently.  May have been exacerbated by a HIIT work-up that she had recently performed. -  Counseled on home exercise therapy and supportive care. -IM Toradol and Depo. -Could consider physical therapy. -Counseled on compression. -Provided Pennsaid samples.

## 2020-03-03 NOTE — Assessment & Plan Note (Signed)
Seems more spasm related.  She does have degenerative changes observed on previous imaging.  No radicular symptoms currently.  May have been exacerbated by a HIIT work-up that she had recently performed. -Counseled on home exercise therapy and supportive care. -IM Toradol and Depo. -Could consider physical therapy. -Counseled on compression. -Provided Pennsaid samples.

## 2020-08-20 ENCOUNTER — Other Ambulatory Visit: Payer: Self-pay

## 2020-08-20 ENCOUNTER — Ambulatory Visit (INDEPENDENT_AMBULATORY_CARE_PROVIDER_SITE_OTHER): Payer: Managed Care, Other (non HMO) | Admitting: Family Medicine

## 2020-08-20 VITALS — BP 100/62 | Ht 65.0 in | Wt 140.0 lb

## 2020-08-20 DIAGNOSIS — M533 Sacrococcygeal disorders, not elsewhere classified: Secondary | ICD-10-CM | POA: Insufficient documentation

## 2020-08-20 DIAGNOSIS — M25851 Other specified joint disorders, right hip: Secondary | ICD-10-CM

## 2020-08-20 NOTE — Progress Notes (Signed)
Medication Samples have been provided to the patient.  Drug name: Duexis     Strength: 800/26.6 mg       Qty: 2 boxes LOT: [id number]  Exp.Date: 11/2020  Dosing instructions: 1 po three times daily  The patient has been instructed regarding the correct time, dose, and frequency of taking this medication, including desired effects and most common side effects.   Jonathon Resides 8:44 AM 08/20/2020

## 2020-08-20 NOTE — Progress Notes (Signed)
  Leslie Cline - 45 y.o. female MRN [id number]  Date of birth: [birth year]  SUBJECTIVE:  Including CC & ROS.  No chief complaint on file.   Leslie Cline is a 45 y.o. female that is presenting with acute on chronic in nature low back and SI joint pain. She has had previous MRI, physical therapy and medications. Pain has returned every year for the past 8 years or so. Pain is occurring over the SI joints and lower back. She experiences altered sensation and pain down the right leg. Having pain while sitting down and lying on her back.    Review of Systems See HPI   HISTORY: Past Medical, Surgical, Social, and Family History Reviewed & Updated per EMR.   Pertinent Historical Findings include:  No past medical history on file.  No past surgical history on file.  No family history on file.  Social History   Socioeconomic History  . Marital status: Married    Spouse name: Not on file  . Number of children: Not on file  . Years of education: Not on file  . Highest education level: Not on file  Occupational History  . Not on file  Tobacco Use  . Smoking status: Never Smoker  . Smokeless tobacco: Never Used  Substance and Sexual Activity  . Alcohol use: Not on file  . Drug use: Not on file  . Sexual activity: Not on file  Other Topics Concern  . Not on file  Social History Narrative  . Not on file   Social Determinants of Health   Financial Resource Strain: Not on file  Food Insecurity: Not on file  Transportation Needs: Not on file  Physical Activity: Not on file  Stress: Not on file  Social Connections: Not on file  Intimate Partner Violence: Not on file     PHYSICAL EXAM:  VS: BP 100/62 (BP Location: Left Arm, Patient Position: Sitting, Cuff Size: Normal)   Ht 5\' 5"  (1.651 m)   Wt 140 lb (63.5 kg)   BMI 23.30 kg/m  Physical Exam Gen: NAD, alert, cooperative with exam, well-appearing MSK:  Back:  Limited flexion  Normal extension  Normal hip flexion   Positive straight leg raise.  Neurovascularly intact     ASSESSMENT & PLAN:   Hip impingement syndrome, right Having pain in the pelvis and over the SI joints.  Pain is been ongoing intermittently for 8 years.  She has tried physical therapy and medications.  She had an MRI of her back that was unrevealing for specific source of her lower back/SI joint/radicular type pain.  She is having altered sensation that extends down the right leg as well.  Concerned that there may be an pelvic abnormality affecting the posterior hip and the nerves as to the source of her current type pain. -Counseled on home exercise therapy and supportive care. -Provided Duexis samples. - xray -MRI of the pelvis to evaluate for pelvic mass or hip impingement.

## 2020-08-20 NOTE — Assessment & Plan Note (Addendum)
Having pain in the pelvis and over the SI joints.  Pain is been ongoing intermittently for 8 years.  She has tried physical therapy and medications.  She had an MRI of her back that was unrevealing for specific source of her lower back/SI joint/radicular type pain.  She is having altered sensation that extends down the right leg as well.  Concerned that there may be an pelvic abnormality affecting the posterior hip and the nerves as to the source of her current type pain. -Counseled on home exercise therapy and supportive care. -Provided Duexis samples. - xray -MRI of the pelvis to evaluate for pelvic mass or hip impingement.

## 2020-08-20 NOTE — Patient Instructions (Signed)
Good to see you Please try the duexis as needed  Please continue the stretching   Please send me a message in MyChart with any questions or updates.  We will schedule a virtual visit once the MRI is resulted.   --Dr. Raeford Razor

## 2020-08-30 ENCOUNTER — Other Ambulatory Visit (HOSPITAL_BASED_OUTPATIENT_CLINIC_OR_DEPARTMENT_OTHER): Payer: Managed Care, Other (non HMO)

## 2020-09-06 ENCOUNTER — Ambulatory Visit (HOSPITAL_BASED_OUTPATIENT_CLINIC_OR_DEPARTMENT_OTHER): Payer: Managed Care, Other (non HMO)

## 2020-09-16 ENCOUNTER — Encounter: Payer: Self-pay | Admitting: *Deleted

## 2020-09-16 NOTE — Progress Notes (Signed)
Authorization for MRI pelvis w/o contrast is authorized. # [id number]. CPT code [phone]. [facility] staff will call patient to schedule.

## 2020-10-23 ENCOUNTER — Other Ambulatory Visit: Payer: Self-pay

## 2020-10-23 ENCOUNTER — Ambulatory Visit (HOSPITAL_BASED_OUTPATIENT_CLINIC_OR_DEPARTMENT_OTHER)
Admission: RE | Admit: 2020-10-23 | Discharge: 2020-10-23 | Disposition: A | Discharge status: Home / Self Care | Payer: Managed Care, Other (non HMO) | Source: Ambulatory Visit | Attending: Family Medicine | Admitting: Family Medicine

## 2020-10-23 DIAGNOSIS — M25851 Other specified joint disorders, right hip: Secondary | ICD-10-CM

## 2020-10-29 ENCOUNTER — Other Ambulatory Visit: Payer: Self-pay

## 2020-10-29 ENCOUNTER — Ambulatory Visit (INDEPENDENT_AMBULATORY_CARE_PROVIDER_SITE_OTHER): Payer: Managed Care, Other (non HMO) | Admitting: Family Medicine

## 2020-10-29 ENCOUNTER — Encounter: Payer: Self-pay | Admitting: Family Medicine

## 2020-10-29 DIAGNOSIS — M5441 Lumbago with sciatica, right side: Secondary | ICD-10-CM

## 2020-10-29 MED ORDER — METHYLPREDNISOLONE ACETATE 40 MG/ML IJ SUSP
40.0000 mg | Freq: Once | INTRAMUSCULAR | Status: AC
  Administered 2020-10-29: 40 mg via INTRAMUSCULAR

## 2020-10-29 MED ORDER — GABAPENTIN 100 MG PO CAPS: 200.0000 mg | ORAL_CAPSULE | Freq: Every day | ORAL | 3 refills | Status: AC

## 2020-10-29 MED ORDER — KETOROLAC TROMETHAMINE 30 MG/ML IJ SOLN
30.0000 mg | Freq: Once | INTRAMUSCULAR | Status: AC
  Administered 2020-10-29: 30 mg via INTRAMUSCULAR

## 2020-10-29 NOTE — Patient Instructions (Signed)
Good to see you Please consider the compression   Please send me a message in MyChart with any questions or updates.  We will setup a virtual visit once the MRI is resulted.   --Dr. Raeford Razor

## 2020-10-29 NOTE — Progress Notes (Addendum)
  [name] - 45 y.o. female MRN [id number]  Date of birth: [birth year]  SUBJECTIVE:  Including CC & ROS.  No chief complaint on file.   Leslie Cline is a 45 y.o. female that is presenting with acute on chronic low back pain with some radicular component currently.  She has been to the chiropractor that seemed to make her symptoms worse.  Physical therapy was improving her symptoms.  Independent review of the pelvis x-ray from 4/11 shows no acute changes.   Review of Systems See HPI   HISTORY: Past Medical, Surgical, Social, and Family History Reviewed & Updated per EMR.   Pertinent Historical Findings include:  No past medical history on file.  No past surgical history on file.  No family history on file.  Social History   Socioeconomic History  . Marital status: Married    Spouse name: Not on file  . Number of children: Not on file  . Years of education: Not on file  . Highest education level: Not on file  Occupational History  . Not on file  Tobacco Use  . Smoking status: Never Smoker  . Smokeless tobacco: Never Used  Substance and Sexual Activity  . Alcohol use: Not on file  . Drug use: Not on file  . Sexual activity: Not on file  Other Topics Concern  . Not on file  Social History Narrative  . Not on file   Social Determinants of Health   Financial Resource Strain: Not on file  Food Insecurity: Not on file  Transportation Needs: Not on file  Physical Activity: Not on file  Stress: Not on file  Social Connections: Not on file  Intimate Partner Violence: Not on file     PHYSICAL EXAM:  VS: BP 102/70 (BP Location: Right Arm, Patient Position: Sitting, Cuff Size: Normal)   Ht 5\' 5"  (1.651 m)   Wt 140 lb (63.5 kg)   BMI 23.30 kg/m  Physical Exam Gen: NAD, alert, cooperative with exam, well-appearing     ASSESSMENT & PLAN:   Low back pain Acute on chronic in nature. Having some radicular pain down the leg now.  She may have component of  hypermobility leading to some of her ongoing pain. - counseled on home exercise therapy and supportive care -Counseled on compression. - IM Toradol and Depo-Medrol. -Awaiting MRI -Gabapentin

## 2020-10-29 NOTE — Assessment & Plan Note (Addendum)
Acute on chronic in nature. Having some radicular pain down the leg now.  She may have component of hypermobility leading to some of her ongoing pain. - counseled on home exercise therapy and supportive care -Counseled on compression. - IM Toradol and Depo-Medrol. -Awaiting MRI -Gabapentin

## 2020-11-05 ENCOUNTER — Encounter: Payer: Self-pay | Admitting: *Deleted

## 2020-11-05 NOTE — Patient Instructions (Signed)
MRI pelvis w/o contrast is approved. Auth # [id number]. Patient is scheduled to have MRI at [facility].

## 2020-11-15 ENCOUNTER — Ambulatory Visit (HOSPITAL_BASED_OUTPATIENT_CLINIC_OR_DEPARTMENT_OTHER)
Admission: RE | Admit: 2020-11-15 | Discharge: 2020-11-15 | Disposition: A | Discharge status: Home / Self Care | Payer: Managed Care, Other (non HMO) | Source: Ambulatory Visit | Attending: Family Medicine | Admitting: Family Medicine

## 2020-11-15 ENCOUNTER — Other Ambulatory Visit: Payer: Self-pay

## 2020-11-15 DIAGNOSIS — M47816 Spondylosis without myelopathy or radiculopathy, lumbar region: Secondary | ICD-10-CM | POA: Insufficient documentation

## 2020-11-15 DIAGNOSIS — M25851 Other specified joint disorders, right hip: Secondary | ICD-10-CM

## 2020-11-18 ENCOUNTER — Other Ambulatory Visit: Payer: Self-pay

## 2020-11-18 ENCOUNTER — Telehealth (INDEPENDENT_AMBULATORY_CARE_PROVIDER_SITE_OTHER): Payer: Managed Care, Other (non HMO) | Admitting: Family Medicine

## 2020-11-18 DIAGNOSIS — M533 Sacrococcygeal disorders, not elsewhere classified: Secondary | ICD-10-CM

## 2020-11-18 NOTE — Assessment & Plan Note (Signed)
Only having minor degenerative changes in the lower lumbar spine.  No changes within the pelvis of the SI joints.  Still seems more associated with SI joint dysfunction instead of inflammatory changes.  Has had previous rheumatologic work-up that yielded no results. -Counseled on home exercise therapy and supportive care. -Counseled on compression. -Could consider SI joint injections. -Can continue physical therapy.

## 2020-11-18 NOTE — Progress Notes (Signed)
Virtual Visit via Video Note  I connected with Leslie Cline on 11/18/20 at  8:00 AM EDT by a video enabled telemedicine application and verified that I am speaking with the correct person using two identifiers.  Location: Patient: work Provider: office   I discussed the limitations of evaluation and management by telemedicine and the availability of in person appointments. The patient expressed understanding and agreed to proceed.  History of Present Illness:  Leslie Cline is a 45 year old female following up after the MRI of her pelvis.  This was not demonstrating any changes within the pelvis itself.  Does show lumbar spondylosis.   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  No changes within the pelvis of the SI joints.  Still seems more associated with SI joint dysfunction instead of inflammatory changes.  Has had previous rheumatologic work-up that yielded no results. -Counseled on home exercise therapy and supportive care. -Counseled on compression. -Could consider SI joint injections. -Can continue physical therapy.  Follow Up Instructions:    I discussed the assessment and treatment plan with the patient. The patient was provided an opportunity to ask questions and all were answered. The patient agreed with the plan and demonstrated an understanding of the instructions.   The patient was advised to call back or seek an in-person evaluation if the symptoms worsen or if the condition fails to improve as anticipated.    Clearance Coots, MD

## 2020-12-31 ENCOUNTER — Emergency Department (HOSPITAL_BASED_OUTPATIENT_CLINIC_OR_DEPARTMENT_OTHER)
Admission: EM | Admit: 2020-12-31 | Discharge: 2020-12-31 | Disposition: A | Discharge status: Home / Self Care | Payer: Managed Care, Other (non HMO) | Attending: Emergency Medicine | Admitting: Emergency Medicine

## 2020-12-31 ENCOUNTER — Other Ambulatory Visit: Payer: Self-pay

## 2020-12-31 ENCOUNTER — Emergency Department (HOSPITAL_BASED_OUTPATIENT_CLINIC_OR_DEPARTMENT_OTHER): Payer: Managed Care, Other (non HMO)

## 2020-12-31 ENCOUNTER — Encounter (HOSPITAL_BASED_OUTPATIENT_CLINIC_OR_DEPARTMENT_OTHER): Payer: Self-pay

## 2020-12-31 DIAGNOSIS — F600 Paranoid personality disorder: Secondary | ICD-10-CM | POA: Diagnosis not present

## 2020-12-31 DIAGNOSIS — Z91040 Latex allergy status: Secondary | ICD-10-CM | POA: Diagnosis not present

## 2020-12-31 DIAGNOSIS — R079 Chest pain, unspecified: Secondary | ICD-10-CM | POA: Insufficient documentation

## 2020-12-31 DIAGNOSIS — Z7982 Long term (current) use of aspirin: Secondary | ICD-10-CM | POA: Diagnosis not present

## 2020-12-31 LAB — HEPATIC FUNCTION PANEL
ALT: 14 U/L (ref 0–44)
AST: 21 U/L (ref 15–41)
Albumin: 4.5 g/dL (ref 3.5–5.0)
Alkaline Phosphatase: 45 U/L (ref 38–126)
Bilirubin, Direct: 0.1 mg/dL (ref 0.0–0.2)
Indirect Bilirubin: 0.6 mg/dL (ref 0.3–0.9)
Total Bilirubin: 0.7 mg/dL (ref 0.3–1.2)
Total Protein: 7.6 g/dL (ref 6.5–8.1)

## 2020-12-31 LAB — CBC WITH DIFFERENTIAL/PLATELET
Abs Immature Granulocytes: 0.01 10*3/uL (ref 0.00–0.07)
Basophils Absolute: 0.1 10*3/uL (ref 0.0–0.1)
Basophils Relative: 1 %
Eosinophils Absolute: 0.3 10*3/uL (ref 0.0–0.5)
Eosinophils Relative: 5 %
HCT: 39.5 % (ref 36.0–46.0)
Hemoglobin: 12.9 g/dL (ref 12.0–15.0)
Immature Granulocytes: 0 %
Lymphocytes Relative: 35 %
Lymphs Abs: 2 10*3/uL (ref 0.7–4.0)
MCH: 29 pg (ref 26.0–34.0)
MCHC: 32.7 g/dL (ref 30.0–36.0)
MCV: 88.8 fL (ref 80.0–100.0)
Monocytes Absolute: 0.5 10*3/uL (ref 0.1–1.0)
Monocytes Relative: 9 %
Neutro Abs: 2.9 10*3/uL (ref 1.7–7.7)
Neutrophils Relative %: 50 %
Platelets: 343 10*3/uL (ref 150–400)
RBC: 4.45 MIL/uL (ref 3.87–5.11)
RDW: 12.8 % (ref 11.5–15.5)
WBC: 5.7 10*3/uL (ref 4.0–10.5)
nRBC: 0 % (ref 0.0–0.2)

## 2020-12-31 LAB — BASIC METABOLIC PANEL
Anion gap: 8 (ref 5–15)
BUN: 14 mg/dL (ref 6–20)
CO2: 25 mmol/L (ref 22–32)
Calcium: 9.1 mg/dL (ref 8.9–10.3)
Chloride: 104 mmol/L (ref 98–111)
Creatinine, Ser: 0.9 mg/dL (ref 0.44–1.00)
GFR, Estimated: >60 mL/min (ref >60)
Glucose, Bld: 105 mg/dL — ABNORMAL HIGH (ref 70–99)
Potassium: 3.5 mmol/L (ref 3.5–5.1)
Sodium: 137 mmol/L (ref 135–145)

## 2020-12-31 LAB — TROPONIN I (HIGH SENSITIVITY)
Troponin I (High Sensitivity): 3 ng/L (ref <18)
Troponin I (High Sensitivity): 3 ng/L (ref <18)

## 2020-12-31 NOTE — Discharge Instructions (Addendum)
Follow-up with cardiology as discussed.  Please return if you are having worsening symptoms.

## 2020-12-31 NOTE — ED Provider Notes (Signed)
[place] EMERGENCY DEPARTMENT Provider Note   CSN: [id number] Arrival date & time: 12/31/20  6237     History Chief Complaint  Patient presents with   Chest Pain    Leslie Cline is a 45 y.o. female.  The history is provided by the patient.  Chest Pain Pain location:  L chest Pain quality: aching   Pain radiates to:  Does not radiate Pain severity:  Mild Onset quality:  Gradual Duration:  3 months Timing:  Intermittent Progression:  Waxing and waning Chronicity:  New Context: at rest   Relieved by:  Nothing Worsened by:  Nothing Associated symptoms: no abdominal pain, no back pain, no cough, no fever, no palpitations, no shortness of breath and no vomiting   Risk factors: no birth control, no coronary artery disease, no diabetes mellitus, no high cholesterol, no hypertension, no prior DVT/PE and no smoking       History reviewed. No pertinent past medical history.  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 No pertinent surgical history.   OB History   No obstetric history on file.     No family history on file.  Social History   Tobacco Use   Smoking status: Never   Smokeless tobacco: Never  Substance Use Topics   Alcohol use: Never   Drug use: Never    Home Medications Prior to Admission medications   Medication Sig Start Date End Date Taking? Authorizing Provider  fluticasone (FLONASE) 50 MCG/ACT nasal spray Place into the nose. 10/31/19  Yes [provider]  nitroGLYCERIN (NITROSTAT) 0.3 MG SL tablet Place under the tongue. 12/30/20 01/29/21 Yes [provider]  pantoprazole (PROTONIX) 40 MG tablet Take 1 tablet by mouth daily. 08/01/20 03/29/21 Yes [provider]  aspirin 81 MG EC tablet Take by mouth.    [provider]  calcium carbonate (OS-CAL) 1250 (500 Ca) MG chewable tablet Chew 1 tablet  by mouth daily.    [provider]  cephALEXin (KEFLEX) 500 MG capsule Take 500 mg by mouth 4 (four) times daily. 09/18/20   [provider]  clobetasol cream (TEMOVATE) 0.05 % clobetasol 0.05 % topical cream    [provider]  gabapentin (NEURONTIN) 100 MG capsule Take 2 capsules (200 mg total) by mouth at bedtime. 10/29/20   Rosemarie Ax, MD  Meth-Hyo-M Barnett Hatter Phos-Ph Sal (URIBEL) 118 MG CAPS Uribel 118 mg-10 mg-40.8 mg-36 mg capsule  1 po tid x 3 days then prn    [provider]  metroNIDAZOLE (METROGEL VAGINAL) 0.75 % vaginal gel Metrogel Vaginal 6.28 %  Insert 1 applicatorful every day by vaginal route for 5 days.    [provider]  Multiple Vitamins-Minerals (CENTROVITE) TABS multivitamin with iron    [provider]  nitroGLYCERIN (NITROSTAT) 0.3 MG SL tablet Place under the tongue. 12/30/20   [provider]  pantoprazole (PROTONIX) 40 MG tablet Take 1 tablet by mouth daily. 12/29/20   [provider]  polyethylene glycol powder (GLYCOLAX/MIRALAX) 17 GM/SCOOP powder Take by mouth.    [provider]  predniSONE (DELTASONE) 50 MG tablet 1 tablet by mouth daily 07/16/19   Lyndal Pulley, DO  valACYclovir (VALTREX) 1000 MG tablet valacyclovir 1 gram tablet    [provider]    Allergies    Latex and Tape  Review of Systems   Review of Systems  Constitutional:  Negative for chills and fever.  HENT:  Negative for ear pain and sore throat.   Eyes:  Negative for pain and visual disturbance.  Respiratory:  Negative for cough and shortness of breath.   Cardiovascular:  Positive for chest pain. Negative for palpitations.  Gastrointestinal:  Negative for abdominal pain and vomiting.  Genitourinary:  Negative for dysuria and hematuria.  Musculoskeletal:  Negative for arthralgias and back pain.  Skin:  Negative for color change and rash.  Neurological:  Negative for seizures and syncope.   Psychiatric/Behavioral:  The patient is nervous/anxious.   All other systems reviewed and are negative.  Physical Exam Updated Vital Signs BP 96/66 (BP Location: Right Arm)   Pulse 65   Temp 97.9 F (36.6 C) (Oral)   Resp 16   Ht 5\' 5"  (1.651 m)   Wt 64.9 kg   LMP 12/20/2020   SpO2 100%   BMI 23.80 kg/m   Physical Exam Vitals and nursing note reviewed.  Constitutional:      General: She is not in acute distress.    Appearance: She is well-developed. She is not ill-appearing.  HENT:     Head: Normocephalic and atraumatic.  Eyes:     Extraocular Movements: Extraocular movements intact.     Conjunctiva/sclera: Conjunctivae normal.     Pupils: Pupils are equal, round, and reactive to light.  Cardiovascular:     Rate and Rhythm: Normal rate and regular rhythm.     Pulses:          Radial pulses are 2+ on the right side and 2+ on the left side.     Heart sounds: Normal heart sounds. No murmur heard. Pulmonary:     Effort: Pulmonary effort is normal. No respiratory distress.     Breath sounds: Normal breath sounds. No decreased breath sounds or wheezing.  Abdominal:     Palpations: Abdomen is soft.     Tenderness: There is no abdominal tenderness.  Musculoskeletal:        General: Normal range of motion.     Cervical back: Normal range of motion and neck supple.     Right lower leg: No edema.     Left lower leg: No edema.  Skin:    General: Skin is warm and dry.  Neurological:     Mental Status: She is alert.  Psychiatric:        Mood and Affect: Mood is anxious.    ED Results / Procedures / Treatments   Labs (all labs ordered are listed, but only abnormal results are displayed) Labs Reviewed  BASIC METABOLIC PANEL - Abnormal; Notable for the following components:      Result Value   Glucose, Bld 105 (*)    All other components within normal limits  CBC WITH DIFFERENTIAL/PLATELET  HEPATIC FUNCTION PANEL  TROPONIN I (HIGH SENSITIVITY)  TROPONIN I (HIGH  SENSITIVITY)    EKG EKG Interpretation  Date/Time:  Wednesday December 31 2020 08:48:36 EDT Ventricular Rate:  86 PR Interval:  131 QRS Duration: 101 QT Interval:  376 QTC Calculation: 450 R Axis:   100 Text Interpretation: Sinus rhythm Consider right atrial enlargement Confirmed by Lennice Sites ([id number]) on 12/31/2020 8:55:45 AM  Radiology DG Chest Portable 1 View  Result Date: 12/31/2020 CLINICAL DATA:  Chest pain EXAM: PORTABLE CHEST 1 VIEW COMPARISON:  None. FINDINGS: The heart size and mediastinal contours are within normal limits. Both lungs are clear. The visualized skeletal structures are unremarkable. Symmetrical nipple shadows  are noted bilaterally. IMPRESSION: No active disease. Electronically Signed   By: Inez Catalina M.D.   On: 12/31/2020 09:15    Procedures Procedures   Medications Ordered in ED Medications - No data to display  ED Course  I have reviewed the triage vital signs and the nursing notes.  Pertinent labs & imaging results that were available during my care of the patient were reviewed by me and considered in my medical decision making (see chart for details).    MDM Rules/Calculators/A&P                          Fynley Chrystal is a 45 year old female who presents the ED with chest pain.  Normal vitals.  No fever.  PERC negative doubt PE.  Overall atypical chest pain.  Has been having chest pain on and off for the last several months.  Is scheduled to see cardiology next week.  She was swimming and after swimming she has some left shoulder pain and took a nitroglycerin that was recently prescribed and actually made her feel worse and felt like her heart was beating fast and made her panicky she feels anxious now.  She is not having chest pain.  Patient swims often does not have chest pain when she works out.  She has a family history in her father of heart disease at young age but otherwise she has no cardiac risk factors.  Heart score is 2.  EKG here shows  sinus rhythm.  No prior EKGs to compare to.  Some nonspecific ST changes.  We will get basic labs, troponin, chest x-ray.  Troponins negative x2.  No significant anemia, electrolyte abnormality, kidney injury.  Chest x-ray shows no pneumonia, no pneumothorax.  Overall low heart score, atypical story.  Discharged in good condition.  Has follow-up with cardiology in place.  Understands return precautions.  Think that an outpatient cardiac work-up is safe for the patient.  This chart was dictated using voice recognition software.  Despite best efforts to proofread,  errors can occur which can change the documentation meaning.   Final Clinical Impression(s) / ED Diagnoses Final diagnoses:  Nonspecific chest pain    Rx / DC Orders ED Discharge Orders     None        Lennice Sites, DO 12/31/20 1205

## 2020-12-31 NOTE — ED Triage Notes (Signed)
Pt arrives tearful to ED stating that she has been having CP on and off, has been seen by PCP and had labs and EKG and has scheduled stress test for next week. States that her PCP gave her Nitro and she took one this morning for left sided chest pain, reports it made her heart beat fast and made her panicky.

## 2020-12-31 NOTE — ED Notes (Signed)
ED Provider at bedside. 

## 2021-01-17 IMAGING — DX DG LUMBAR SPINE COMPLETE 4+V
5 series · 5 of 5 positions shown · non-contrast
Comparison: None.

CLINICAL DATA: Back pain with lower extremity radicular symptoms

EXAM:
LUMBAR SPINE - COMPLETE 4+ VIEW

[lumbar spine ap]
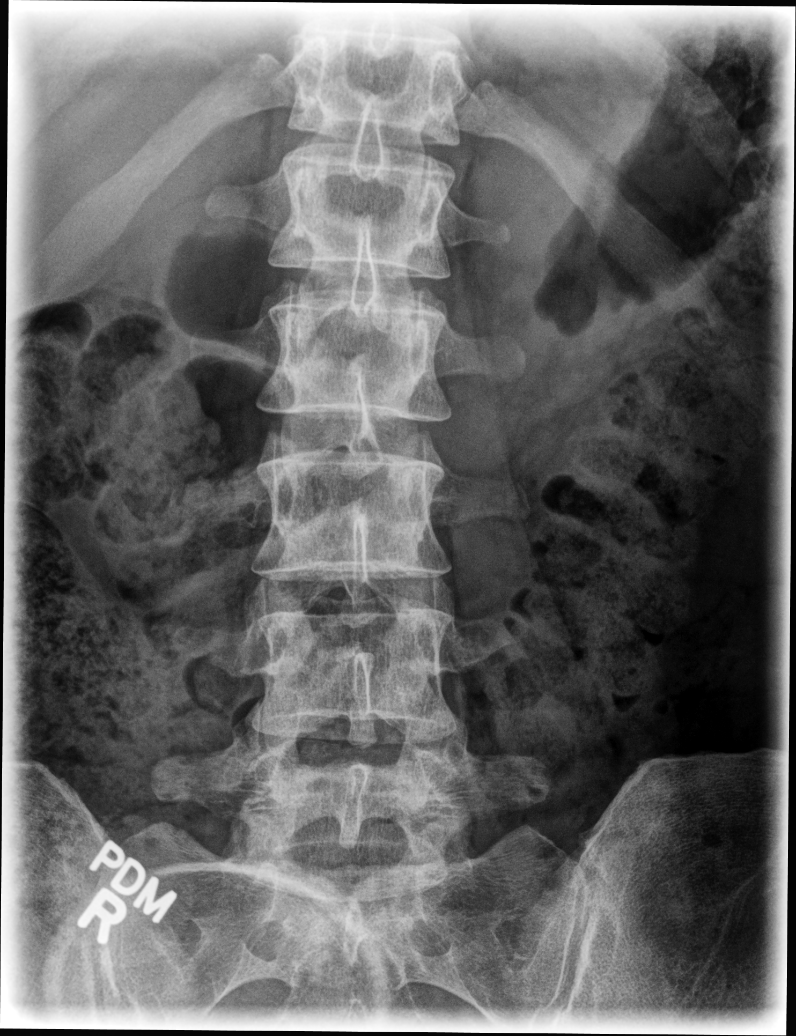

[lumbar spine mlo (1 of 2)]
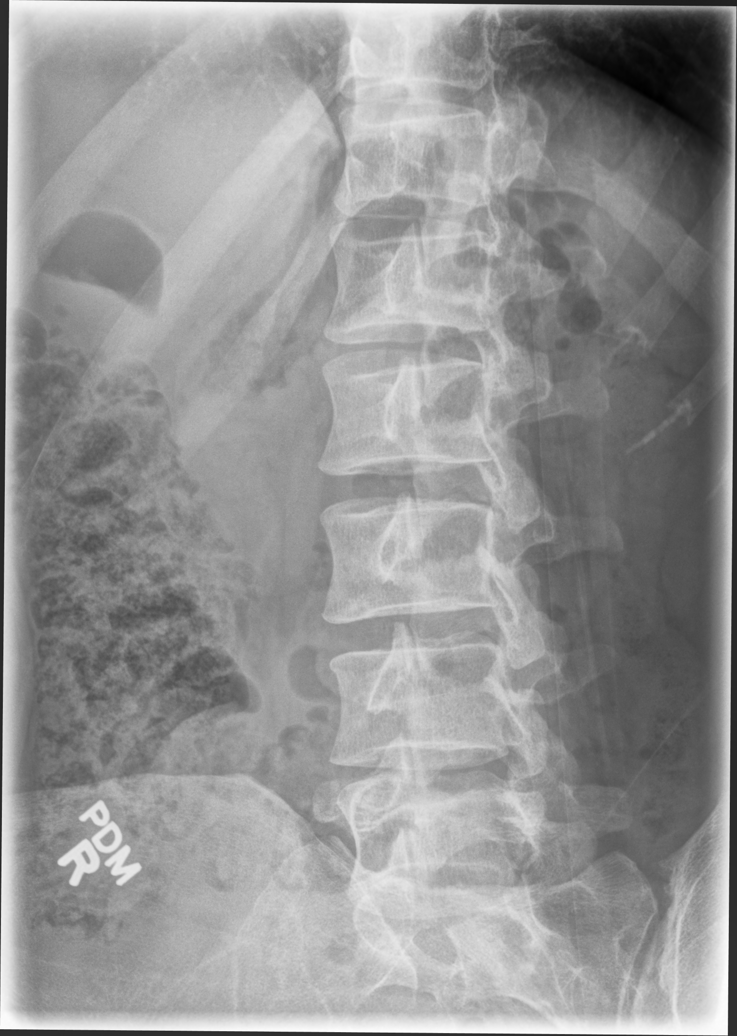

[lumbar spine mlo (2 of 2)]
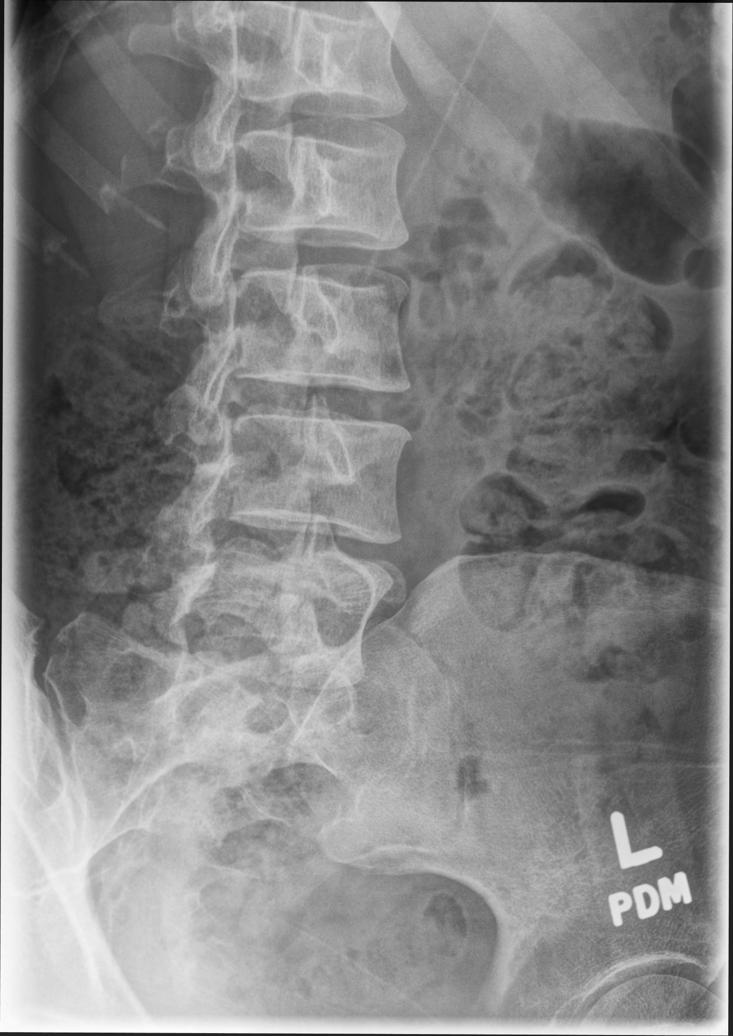

[lumbar spine lat (1 of 2)]
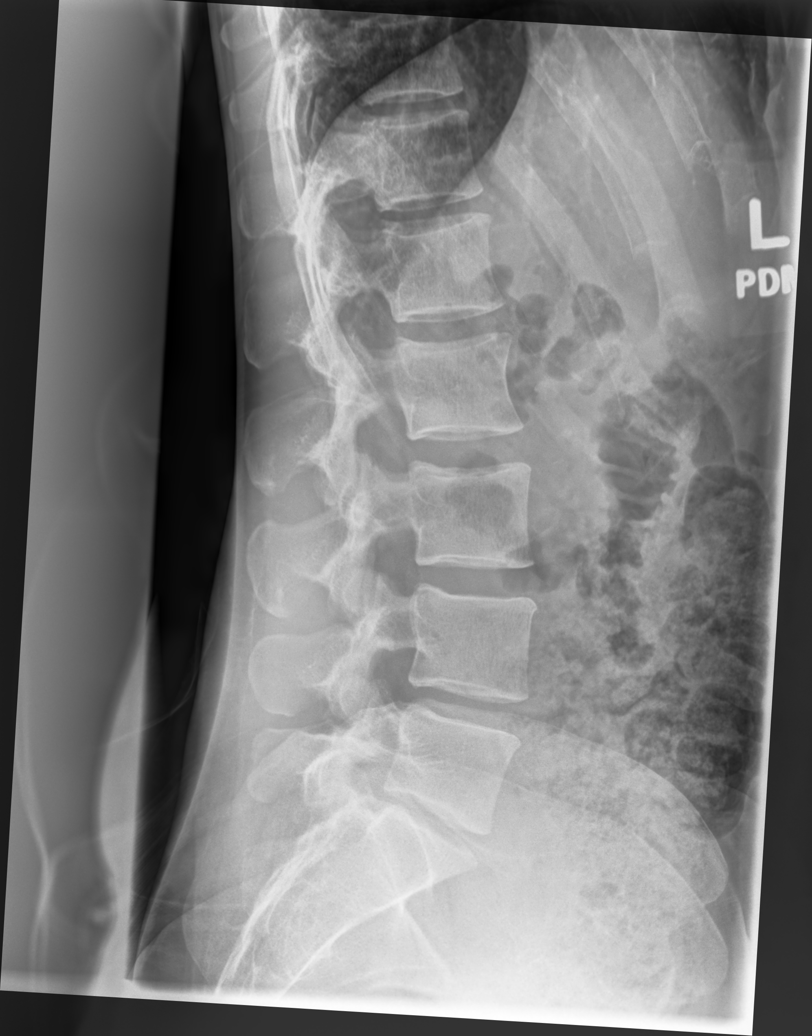

[lumbar spine lat (2 of 2)]
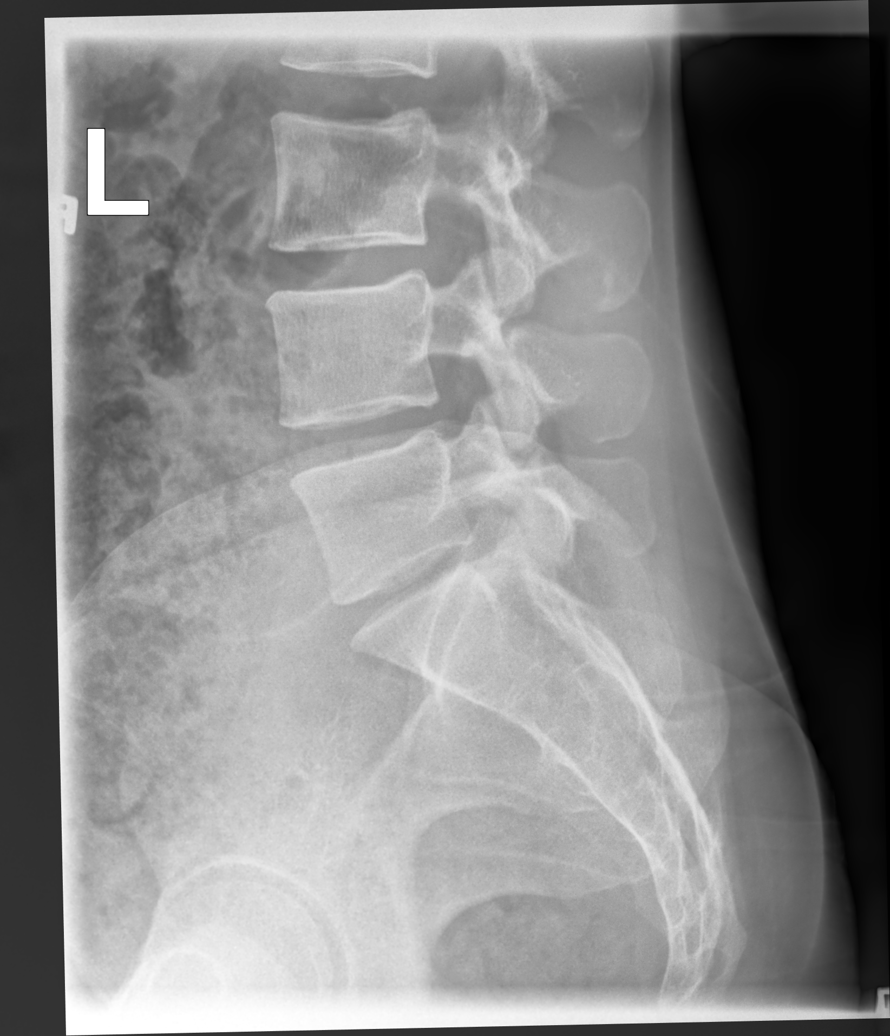

[5 of 5 positions shown; findings below may reference images not displayed]

FINDINGS: Frontal, lateral, spot lumbosacral lateral, and bilateral oblique
views were obtained. There are 5 non-rib-bearing lumbar type
vertebral bodies. There is mild lumbar dextroscoliosis. There is no
fracture or spondylolisthesis. There is moderate disc space
narrowing at L5-S1. Other disc spaces appear unremarkable. There is
no appreciable facet arthropathy.
IMPRESSION: Moderate disc space narrowing at L5-S1. Other disc spaces appear
unremarkable. No appreciable facet arthropathy. No fracture or
spondylolisthesis. Slight scoliosis.

## 2022-08-11 ENCOUNTER — Ambulatory Visit (INDEPENDENT_AMBULATORY_CARE_PROVIDER_SITE_OTHER): Payer: Managed Care, Other (non HMO) | Admitting: Family Medicine

## 2022-08-11 ENCOUNTER — Encounter: Payer: Self-pay | Admitting: Family Medicine

## 2022-08-11 VITALS — BP 118/70 | Ht 65.0 in | Wt 138.0 lb

## 2022-08-11 DIAGNOSIS — M533 Sacrococcygeal disorders, not elsewhere classified: Secondary | ICD-10-CM | POA: Diagnosis not present

## 2022-08-11 MED ORDER — FAMOTIDINE 20 MG PO TABS: 20.0000 mg | ORAL_TABLET | Freq: Two times a day (BID) | ORAL | 1 refills | Status: AC

## 2022-08-11 MED ORDER — KETOROLAC TROMETHAMINE 30 MG/ML IJ SOLN
30.0000 mg | Freq: Once | INTRAMUSCULAR | Status: AC
  Administered 2022-08-11: 30 mg via INTRAMUSCULAR

## 2022-08-11 MED ORDER — METHYLPREDNISOLONE ACETATE 40 MG/ML IJ SUSP
40.0000 mg | Freq: Once | INTRAMUSCULAR | Status: AC
  Administered 2022-08-11: 40 mg via INTRAMUSCULAR

## 2022-08-11 NOTE — Assessment & Plan Note (Signed)
Acute on chronic in nature.  Having exacerbation of her previous type pain.  Pain is mainly at night with interfering with her sleep. -Counseled on home exercise therapy and supportive care. -IM Depo-Medrol and Toradol. -Famotidine. -Could consider physical therapy or injection.

## 2022-08-11 NOTE — Patient Instructions (Signed)
Good to see you Please alternate heat and ice  Please continue exercising  You can consider compression   Please send me a message in MyChart with any questions or updates.  Please see me back in 4 weeks or as needed if better.   --Dr. Raeford Razor

## 2022-08-11 NOTE — Progress Notes (Signed)
  Leslie Cline - 47 y.o. female MRN [id number]  Date of birth: 10-28-1975  SUBJECTIVE:  Including CC & ROS.  No chief complaint on file.   Leslie Cline is a 47 y.o. female that is presenting with acute on chronic right-sided low back and SI joint pain.  The pain started over the past few days.  Also having pain down the right received to be worse at night.    Review of Systems See HPI   HISTORY: Past Medical, Surgical, Social, and Family History Reviewed & Updated per EMR.   Pertinent Historical Findings include:  History reviewed. No pertinent past medical history.  History reviewed. No pertinent surgical history.   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  Having exacerbation of her previous type pain.  Pain is mainly at night with interfering with her sleep. -Counseled on home exercise therapy and supportive care. -IM Depo-Medrol and Toradol. -Famotidine. -Could consider physical therapy or injection.

## 2022-11-04 ENCOUNTER — Encounter: Payer: Self-pay | Admitting: *Deleted

## 2023-04-23 ENCOUNTER — Emergency Department (HOSPITAL_BASED_OUTPATIENT_CLINIC_OR_DEPARTMENT_OTHER)
Admission: EM | Admit: 2023-04-23 | Discharge: 2023-04-24 | Disposition: A | Discharge status: Home / Self Care | Payer: Managed Care, Other (non HMO) | Attending: Emergency Medicine | Admitting: Emergency Medicine

## 2023-04-23 ENCOUNTER — Emergency Department (HOSPITAL_BASED_OUTPATIENT_CLINIC_OR_DEPARTMENT_OTHER): Payer: Managed Care, Other (non HMO)

## 2023-04-23 ENCOUNTER — Encounter (HOSPITAL_BASED_OUTPATIENT_CLINIC_OR_DEPARTMENT_OTHER): Payer: Self-pay | Admitting: Emergency Medicine

## 2023-04-23 DIAGNOSIS — R079 Chest pain, unspecified: Secondary | ICD-10-CM | POA: Insufficient documentation

## 2023-04-23 DIAGNOSIS — R739 Hyperglycemia, unspecified: Secondary | ICD-10-CM | POA: Insufficient documentation

## 2023-04-23 DIAGNOSIS — Z7982 Long term (current) use of aspirin: Secondary | ICD-10-CM | POA: Diagnosis not present

## 2023-04-23 DIAGNOSIS — Z91040 Latex allergy status: Secondary | ICD-10-CM | POA: Diagnosis not present

## 2023-04-23 LAB — BASIC METABOLIC PANEL
Anion gap: 11 (ref 5–15)
BUN: 17 mg/dL (ref 6–20)
CO2: 23 mmol/L (ref 22–32)
Calcium: 9 mg/dL (ref 8.9–10.3)
Chloride: 105 mmol/L (ref 98–111)
Creatinine, Ser: 0.84 mg/dL (ref 0.44–1.00)
GFR, Estimated: >60 mL/min (ref >60)
Glucose, Bld: 132 mg/dL — ABNORMAL HIGH (ref 70–99)
Potassium: 3.5 mmol/L (ref 3.5–5.1)
Sodium: 139 mmol/L (ref 135–145)

## 2023-04-23 LAB — CBC WITH DIFFERENTIAL/PLATELET
Abs Immature Granulocytes: 0.02 10*3/uL (ref 0.00–0.07)
Basophils Absolute: 0 10*3/uL (ref 0.0–0.1)
Basophils Relative: 1 %
Eosinophils Absolute: 0.2 10*3/uL (ref 0.0–0.5)
Eosinophils Relative: 4 %
HCT: 34.6 % — ABNORMAL LOW (ref 36.0–46.0)
Hemoglobin: 11.5 g/dL — ABNORMAL LOW (ref 12.0–15.0)
Immature Granulocytes: 0 %
Lymphocytes Relative: 40 %
Lymphs Abs: 2.2 10*3/uL (ref 0.7–4.0)
MCH: 29.5 pg (ref 26.0–34.0)
MCHC: 33.2 g/dL (ref 30.0–36.0)
MCV: 88.7 fL (ref 80.0–100.0)
Monocytes Absolute: 0.6 10*3/uL (ref 0.1–1.0)
Monocytes Relative: 10 %
Neutro Abs: 2.5 10*3/uL (ref 1.7–7.7)
Neutrophils Relative %: 45 %
Platelets: 250 10*3/uL (ref 150–400)
RBC: 3.9 MIL/uL (ref 3.87–5.11)
RDW: 13.2 % (ref 11.5–15.5)
WBC: 5.5 10*3/uL (ref 4.0–10.5)
nRBC: 0 % (ref 0.0–0.2)

## 2023-04-23 LAB — TROPONIN I (HIGH SENSITIVITY): Troponin I (High Sensitivity): <2 ng/L (ref <18)

## 2023-04-23 LAB — HCG, SERUM, QUALITATIVE: Preg, Serum: NEGATIVE

## 2023-04-23 LAB — D-DIMER, QUANTITATIVE: D-Dimer, Quant: <0.27 ug{FEU}/mL (ref 0.00–0.50)

## 2023-04-23 MED ORDER — SODIUM CHLORIDE 0.9 % IV BOLUS
1000.0000 mL | Freq: Once | INTRAVENOUS | Status: AC
  Administered 2023-04-23: 1000 mL via INTRAVENOUS

## 2023-04-23 NOTE — ED Provider Notes (Signed)
[place] [facility] DEPART[facility] AT [facility] Provider Note   CSN: [id number] Arrival date & time: 04/23/23  2202     History {Add pertinent medical, surgical, social history, OB history to HPI:1} Chief Complaint  Patient presents with   Chest Pain    Leslie Cline is a 47 y.o. female.  She has a prior history of SVT.  She said she had finished mowing the lawn and was napping on the couch tonight when she woke up with sharp stabbing left upper chest pain.  It is worse with taking a deep breath.  Caused her to feel very anxious and dry in her mouth.  She tried drinking some electrolyte solution which did not resolve her symptoms and she wanted to come here to be evaluated.  She denies having had this pain before but she has had problems with rapid heart rates in the past.  Her father had a cardiac event at age 33 so she is nervous about this happening to her.  The history is provided by the patient.  Chest Pain Pain location:  L chest Pain quality: sharp and stabbing   Pain radiates to:  Does not radiate Pain severity:  Moderate Onset quality:  Sudden Timing:  Intermittent Progression:  Improving Chronicity:  New Context: at rest   Relieved by:  Nothing Worsened by:  Deep breathing Associated symptoms: no abdominal pain, no cough, no diaphoresis, no fever, no nausea, no shortness of breath, no syncope and no vomiting        Home Medications Prior to Admission medications   Medication Sig Start Date End Date Taking? Authorizing Provider  aspirin 81 MG EC tablet Take by mouth.    [provider]  calcium carbonate (OS-CAL) 1250 (500 Ca) MG chewable tablet Chew 1 tablet by mouth daily.    [provider]  cephALEXin (KEFLEX) 500 MG capsule Take 500 mg by mouth 4 (four) times daily. 09/18/20   [provider]  clobetasol cream (TEMOVATE) 0.05 % clobetasol 0.05 % topical cream    [provider]  famotidine (PEPCID) 20 MG tablet  Take 1 tablet (20 mg total) by mouth 2 (two) times daily. 08/11/22   Myra Rude, MD  fluticasone Aleda Grana) 50 MCG/ACT nasal spray Place into the nose. 10/31/19   [provider]  gabapentin (NEURONTIN) 100 MG capsule Take 2 capsules (200 mg total) by mouth at bedtime. 10/29/20   Myra Rude, MD  Meth-Hyo-M Salley Hews Phos-Ph Sal (URIBEL) 118 MG CAPS Uribel 118 mg-10 mg-40.8 mg-36 mg capsule  1 po tid x 3 days then prn    [provider]  metroNIDAZOLE (METROGEL VAGINAL) 0.75 % vaginal gel Metrogel Vaginal 0.75 %  Insert 1 applicatorful every day by vaginal route for 5 days.    [provider]  Multiple Vitamins-Minerals (CENTROVITE) TABS multivitamin with iron    [provider]  nitroGLYCERIN (NITROSTAT) 0.3 MG SL tablet Place under the tongue. 12/30/20 01/29/21  [provider]  nitroGLYCERIN (NITROSTAT) 0.3 MG SL tablet Place under the tongue. 12/30/20   [provider]  pantoprazole (PROTONIX) 40 MG tablet Take 1 tablet by mouth daily. 12/29/20   [provider]  pantoprazole (PROTONIX) 40 MG tablet Take 1 tablet by mouth daily. 08/01/20 03/29/21  [provider]  polyethylene glycol powder (GLYCOLAX/MIRALAX) 17 GM/SCOOP powder Take by mouth.    [provider]  predniSONE (DELTASONE) 50 MG tablet 1 tablet by mouth daily 07/16/19   Judi Saa,  DO  valACYclovir (VALTREX) 1000 MG tablet valacyclovir 1 gram tablet    [provider]      Allergies    Latex and Tape    Review of Systems   Review of Systems  Constitutional:  Negative for diaphoresis and fever.  Respiratory:  Negative for cough and shortness of breath.   Cardiovascular:  Positive for chest pain. Negative for syncope.  Gastrointestinal:  Negative for abdominal pain, nausea and vomiting.  Neurological:  Positive for tremors.    Physical Exam Updated Vital Signs Ht 5\' 5"  (1.651 m)   Wt 63.5 kg   LMP 03/21/2023   BMI 23.30 kg/m   Physical Exam Vitals and nursing note reviewed.  Constitutional:      General: She is not in acute distress.    Appearance: Normal appearance. She is well-developed.  HENT:     Head: Normocephalic and atraumatic.  Eyes:     Conjunctiva/sclera: Conjunctivae normal.  Cardiovascular:     Rate and Rhythm: Normal rate and regular rhythm.     Heart sounds: Normal heart sounds. No murmur heard. Pulmonary:     Effort: Pulmonary effort is normal. No respiratory distress.     Breath sounds: Normal breath sounds.  Abdominal:     Palpations: Abdomen is soft.     Tenderness: There is no abdominal tenderness.  Musculoskeletal:        General: No swelling. Normal range of motion.     Cervical back: Neck supple.     Right lower leg: No tenderness. No edema.     Left lower leg: No tenderness. No edema.  Skin:    General: Skin is warm and dry.     Capillary Refill: Capillary refill takes less than 2 seconds.  Neurological:     General: No focal deficit present.     Mental Status: She is alert.     Sensory: No sensory deficit.     Motor: No weakness.     ED Results / Procedures / Treatments   Labs (all labs ordered are listed, but only abnormal results are displayed) Labs Reviewed  BASIC METABOLIC PANEL  CBC WITH DIFFERENTIAL/PLATELET  D-DIMER, QUANTITATIVE  HCG, SERUM, QUALITATIVE  TROPONIN I (HIGH SENSITIVITY)    EKG EKG Interpretation Date/Time:  Saturday April 23 2023 22:11:45 EDT Ventricular Rate:  94 PR Interval:  142 QRS Duration:  88 QT Interval:  336 QTC Calculation: 421 R Axis:   84  Text Interpretation: Sinus rhythm Low voltage, precordial leads Minimal ST depression, lateral leads No significant change since prior 6/22 Confirmed by Meridee Score [phone]) on 04/23/2023 10:18:06 PM  Radiology No results found.  Procedures Procedures  {Document cardiac monitor, telemetry assessment procedure when appropriate:1}  Medications Ordered in ED Medications  sodium  chloride 0.9 % bolus 1,000 mL (has no administration in time range)    ED Course/ Medical Decision Making/ A&P   {   Click here for ABCD2, HEART and other calculatorsREFRESH Note before signing :1}                              Medical Decision Making Amount and/or Complexity of Data Reviewed Labs: ordered. Radiology: ordered.   This patient complains of ***; this involves an extensive number of treatment Options and is a complaint that carries with it a high risk of complications and morbidity. The differential includes ***  I ordered, reviewed and interpreted labs, which included *** I  ordered medication *** and reviewed PMP when indicated. I ordered imaging studies which included *** and I independently    visualized and interpreted imaging which showed *** Additional history obtained from *** Previous records obtained and reviewed *** I consulted *** and discussed lab and imaging findings and discussed disposition.  Cardiac monitoring reviewed, *** Social determinants considered, *** Critical Interventions: ***  After the interventions stated above, I reevaluated the patient and found *** Admission and further testing considered, ***   {Document critical care time when appropriate:1} {Document review of labs and clinical decision tools ie heart score, Chads2Vasc2 etc:1}  {Document your independent review of radiology images, and any outside records:1} {Document your discussion with family members, caretakers, and with consultants:1} {Document social determinants of health affecting pt's care:1} {Document your decision making why or why not admission, treatments were needed:1} Final Clinical Impression(s) / ED Diagnoses Final diagnoses:  None    Rx / DC Orders ED Discharge Orders     None

## 2023-04-23 NOTE — ED Provider Notes (Signed)
Received patient in turnover from Dr. Charm Barges.  Please see their note for further details of Hx, PE.  Briefly patient is a 47 y.o. female with a Chest Pain .  Patient had mowed the lawn without any symptoms and then woke up with some sharp chest pain.  Plan for 2 troponins D-dimer.Marland Kitchen

## 2023-04-23 NOTE — ED Triage Notes (Signed)
Pt c/p LT CP that is stabbing in nature; she had just mowed the yard when it began; also reports she is shaking, but not cold (visibly shaking in triage); she drank 32 oz of electrolytes, but still feels dehydrated

## 2023-04-24 LAB — TROPONIN I (HIGH SENSITIVITY): Troponin I (High Sensitivity): <2 ng/L

## 2023-04-24 NOTE — Discharge Instructions (Signed)
Take 4 over the counter ibuprofen tablets 3 times a day or 2 over-the-counter naproxen tablets twice a day for pain. Also take tylenol 1000mg (2 extra strength) four times a day.    Follow up with your family doc in the office.  Please return for sudden worsening pain difficulty breathing or if you start coughing up blood.
